# Patient Record
Sex: Female | Born: 1970 | Race: White | Hispanic: No | Marital: Married | Smoking: Never smoker
Health system: Southern US, Community
[De-identification: ages and names within clinical notes are randomized; demographics above are authoritative.]

## PROBLEM LIST (undated history)

## (undated) DIAGNOSIS — R87629 Unspecified abnormal cytological findings in specimens from vagina: Secondary | ICD-10-CM

## (undated) DIAGNOSIS — J338 Other polyp of sinus: Secondary | ICD-10-CM

## (undated) DIAGNOSIS — F329 Major depressive disorder, single episode, unspecified: Secondary | ICD-10-CM

## (undated) DIAGNOSIS — F32A Depression, unspecified: Secondary | ICD-10-CM

## (undated) DIAGNOSIS — Q51810 Arcuate uterus: Secondary | ICD-10-CM

## (undated) DIAGNOSIS — G44219 Episodic tension-type headache, not intractable: Secondary | ICD-10-CM

## (undated) HISTORY — PX: OVARIAN CYST SURGERY: SHX726

## (undated) HISTORY — DX: Major depressive disorder, single episode, unspecified: F32.9

## (undated) HISTORY — DX: Episodic tension-type headache, not intractable: G44.219

## (undated) HISTORY — PX: LEEP: SHX91

## (undated) HISTORY — DX: Other polyp of sinus: J33.8

## (undated) HISTORY — DX: Depression, unspecified: F32.A

## (undated) HISTORY — PX: NASAL SINUS SURGERY: SHX719

## (undated) HISTORY — PX: BREAST ENHANCEMENT SURGERY: SHX7

## (undated) HISTORY — PX: DILATION AND CURETTAGE OF UTERUS: SHX78

## (undated) HISTORY — PX: TONSILLECTOMY AND ADENOIDECTOMY: SUR1326

---

## 1998-01-13 ENCOUNTER — Other Ambulatory Visit: Admission: RE | Admit: 1998-01-13 | Discharge: 1998-01-13 | Payer: Self-pay | Admitting: Obstetrics and Gynecology

## 1999-01-05 ENCOUNTER — Other Ambulatory Visit: Admission: RE | Admit: 1999-01-05 | Discharge: 1999-01-05 | Payer: Self-pay | Admitting: Obstetrics and Gynecology

## 1999-01-29 ENCOUNTER — Other Ambulatory Visit: Admission: RE | Admit: 1999-01-29 | Discharge: 1999-01-29 | Payer: Self-pay | Admitting: *Deleted

## 1999-01-29 ENCOUNTER — Encounter (INDEPENDENT_AMBULATORY_CARE_PROVIDER_SITE_OTHER): Payer: Self-pay

## 1999-03-08 ENCOUNTER — Encounter (INDEPENDENT_AMBULATORY_CARE_PROVIDER_SITE_OTHER): Payer: Self-pay | Admitting: Specialist

## 1999-03-08 ENCOUNTER — Other Ambulatory Visit: Admission: RE | Admit: 1999-03-08 | Discharge: 1999-03-08 | Payer: Self-pay | Admitting: Obstetrics and Gynecology

## 1999-06-24 ENCOUNTER — Other Ambulatory Visit: Admission: RE | Admit: 1999-06-24 | Discharge: 1999-06-24 | Payer: Self-pay | Admitting: Obstetrics and Gynecology

## 2000-07-17 ENCOUNTER — Other Ambulatory Visit: Admission: RE | Admit: 2000-07-17 | Discharge: 2000-07-17 | Payer: Self-pay | Admitting: Obstetrics and Gynecology

## 2000-09-14 ENCOUNTER — Encounter (INDEPENDENT_AMBULATORY_CARE_PROVIDER_SITE_OTHER): Payer: Self-pay | Admitting: Specialist

## 2000-09-14 ENCOUNTER — Ambulatory Visit (HOSPITAL_COMMUNITY): Admission: RE | Admit: 2000-09-14 | Discharge: 2000-09-14 | Payer: Self-pay | Admitting: Obstetrics and Gynecology

## 2001-05-03 ENCOUNTER — Encounter: Payer: Self-pay | Admitting: Emergency Medicine

## 2001-05-03 ENCOUNTER — Emergency Department (HOSPITAL_COMMUNITY): Admission: EM | Admit: 2001-05-03 | Discharge: 2001-05-03 | Payer: Self-pay | Admitting: Emergency Medicine

## 2001-07-23 ENCOUNTER — Other Ambulatory Visit: Admission: RE | Admit: 2001-07-23 | Discharge: 2001-07-23 | Payer: Self-pay | Admitting: Obstetrics and Gynecology

## 2002-08-19 ENCOUNTER — Other Ambulatory Visit: Admission: RE | Admit: 2002-08-19 | Discharge: 2002-08-19 | Payer: Self-pay | Admitting: Obstetrics and Gynecology

## 2003-09-01 ENCOUNTER — Other Ambulatory Visit: Admission: RE | Admit: 2003-09-01 | Discharge: 2003-09-01 | Payer: Self-pay | Admitting: Obstetrics and Gynecology

## 2004-05-18 ENCOUNTER — Other Ambulatory Visit: Admission: RE | Admit: 2004-05-18 | Discharge: 2004-05-18 | Payer: Self-pay | Admitting: Obstetrics and Gynecology

## 2005-05-17 ENCOUNTER — Other Ambulatory Visit: Admission: RE | Admit: 2005-05-17 | Discharge: 2005-05-17 | Payer: Self-pay | Admitting: Obstetrics and Gynecology

## 2007-09-02 ENCOUNTER — Emergency Department (HOSPITAL_COMMUNITY): Admission: EM | Admit: 2007-09-02 | Discharge: 2007-09-02 | Payer: Self-pay | Admitting: Emergency Medicine

## 2009-04-19 IMAGING — CT CT ABDOMEN W/O CM
2 of 4 series · 17 of 46 positions shown, 19 images · non-contrast
Comparison: none

CLINICAL DATA: Bilateral flank pain.  
ABDOMEN CT WITHOUT CONTRAST ? 09/02/07:
TECHNIQUE: Multidetector CT imaging of the abdomen was performed following the standard protocol without IV contrast. 
No priors for comparison.
TECHNIQUE: Multidetector CT imaging of the pelvis was performed following the standard protocol without IV contrast.

[Series 2: <(id) w/o a/p >(id) · axial · non-contrast · 0.65mm/px · z∈[-431,-46]mm · 14 of 85 slices shown, 16 images]
[im 4/85  soft-tissue]
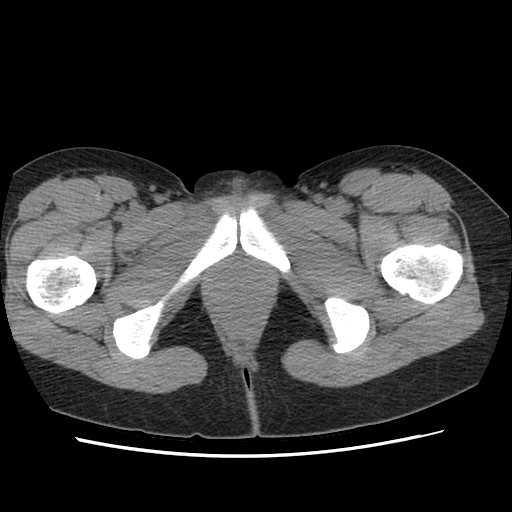
[im 4/85  bone]
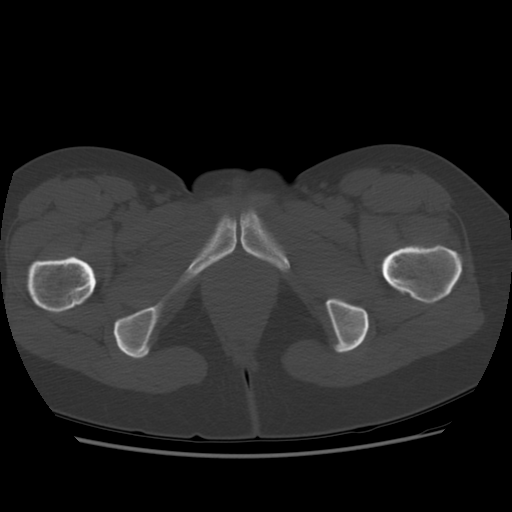
[im 11/85  soft-tissue]
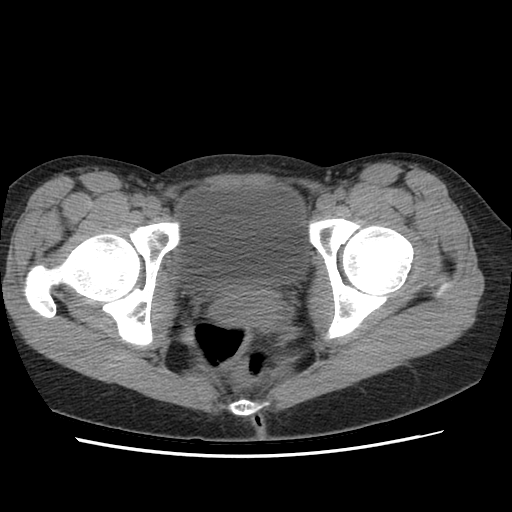
[im 17/85  soft-tissue]
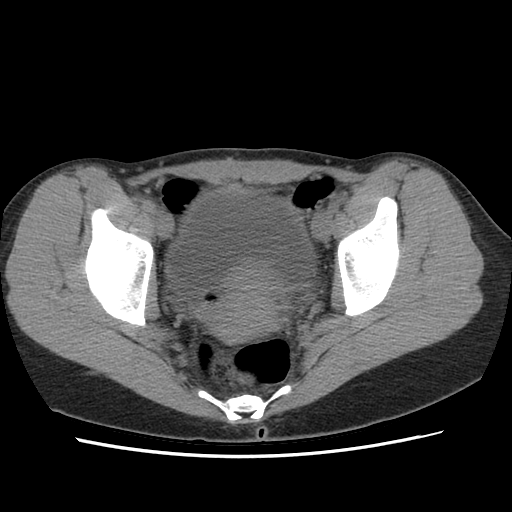
[im 24/85  soft-tissue]
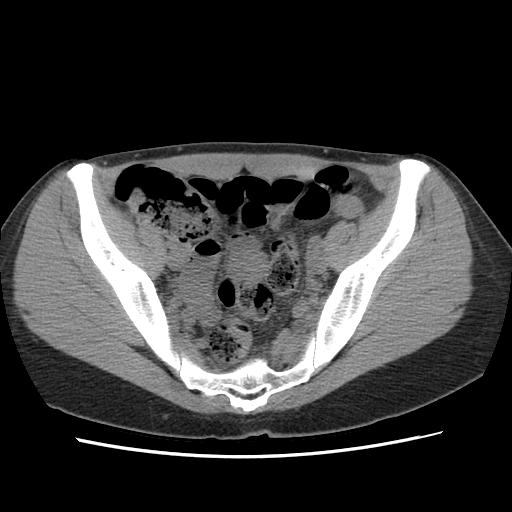
[im 27/85  soft-tissue]
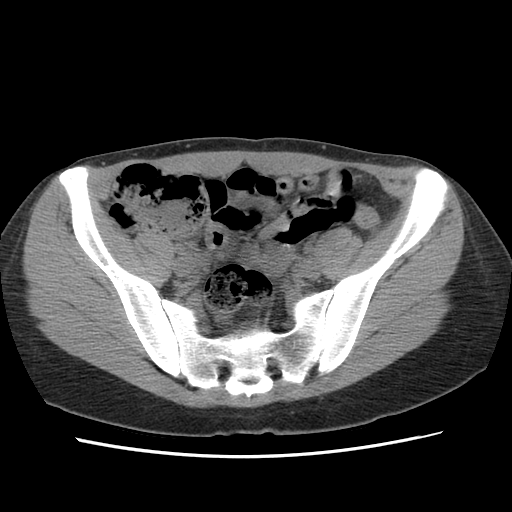
[im 34/85  soft-tissue]
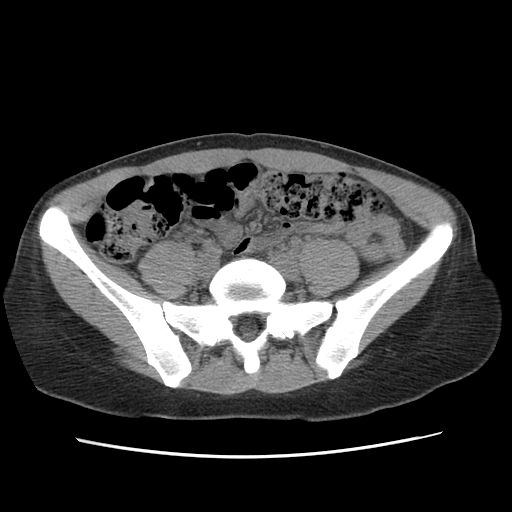
[im 41/85  soft-tissue]
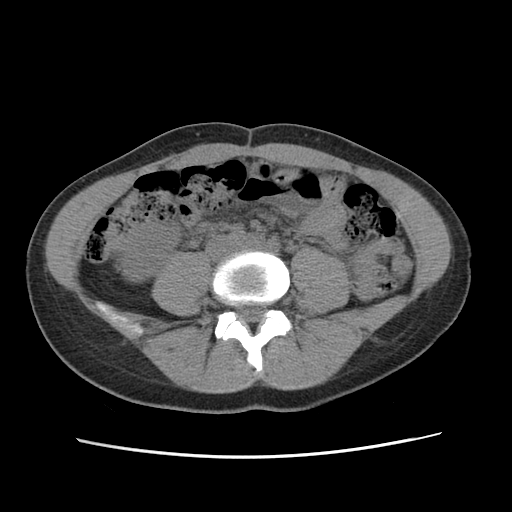
[im 44/85  soft-tissue]
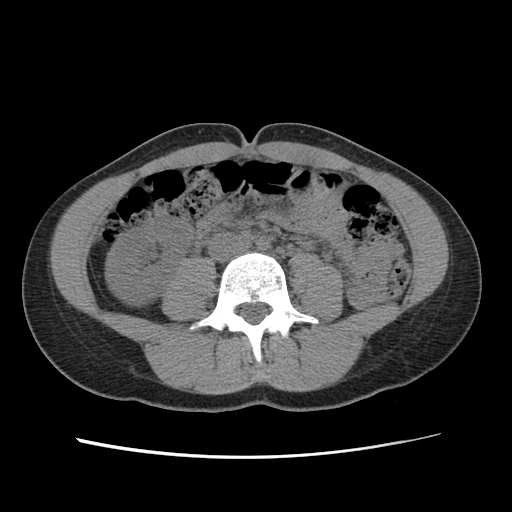
[im 51/85  soft-tissue]
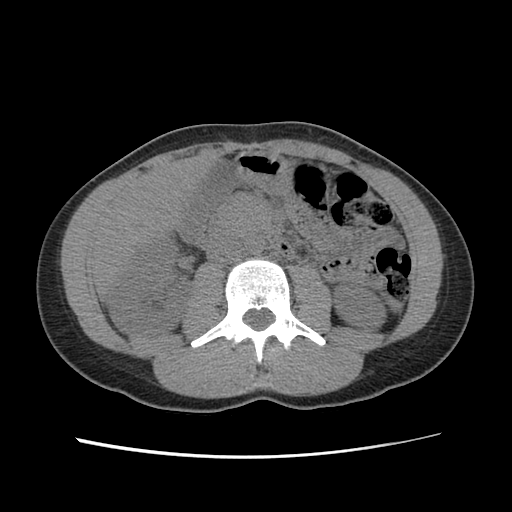
[im 51/85  bone]
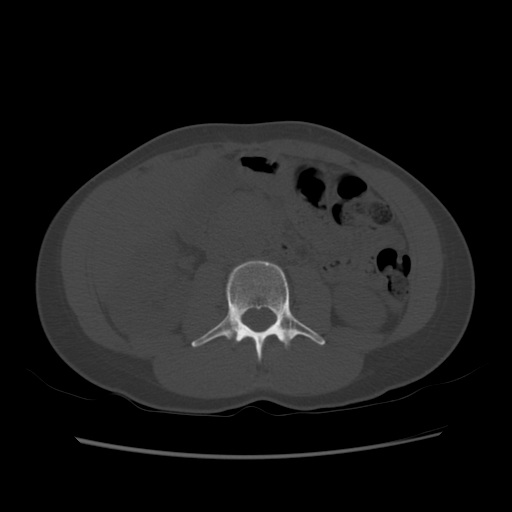
[im 58/85  soft-tissue]
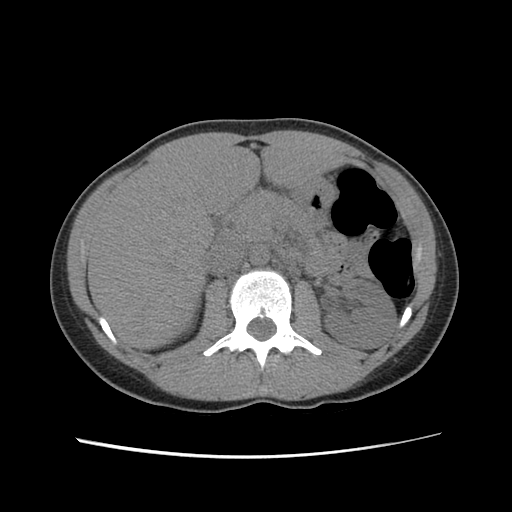
[im 64/85  soft-tissue]
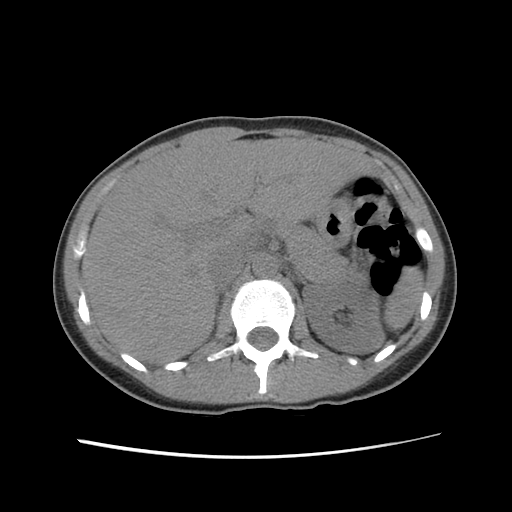
[im 68/85  soft-tissue]
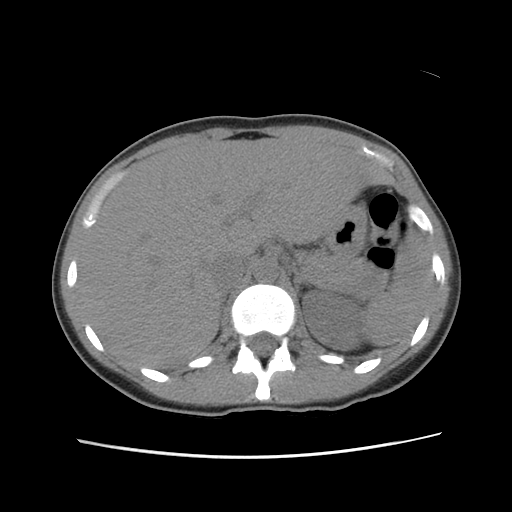
[im 74/85  soft-tissue]
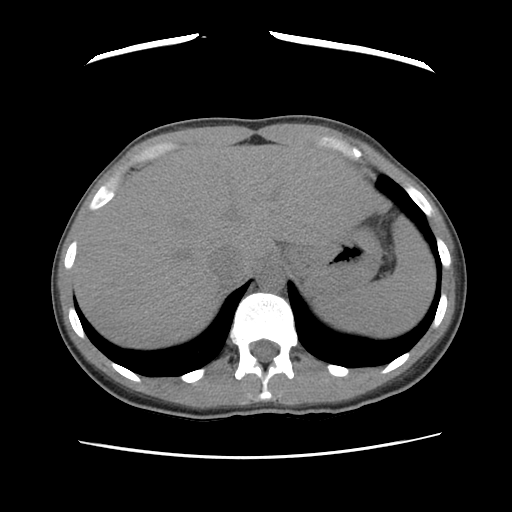
[im 81/85  soft-tissue]
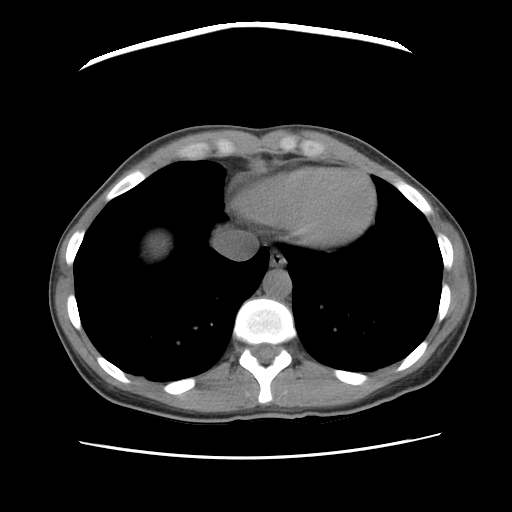

[Series 400: cor a/p · coronal · 0.86mm/px · 3 of 53 slices shown]
[im 18/53  soft-tissue]
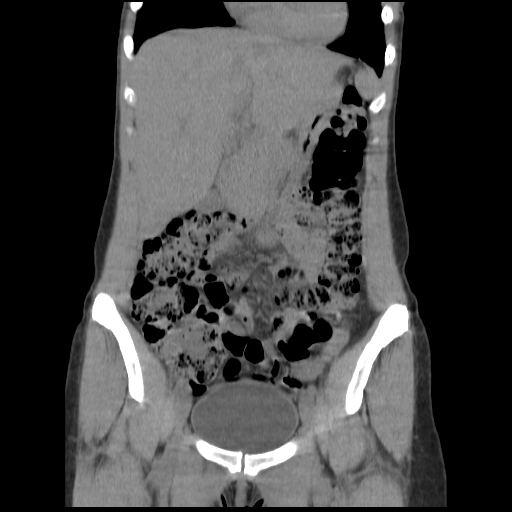
[im 24/53  soft-tissue]
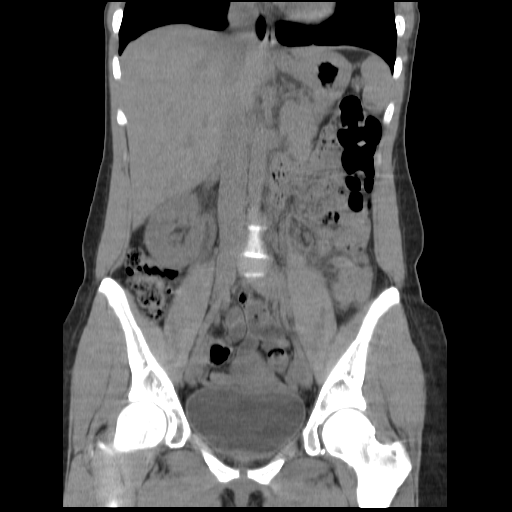
[im 29/53  soft-tissue]
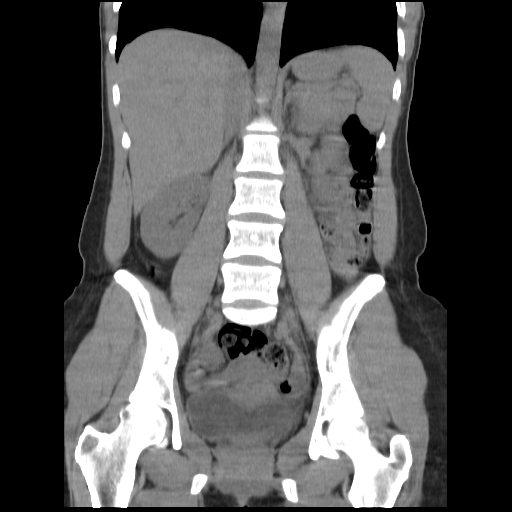

[17 of 46 positions shown; findings below may reference images not displayed]

FINDINGS: There is focal atelectasis in the right lower lobe.  Negative for pleural effusion. 
No renal calculi are identified.  Both kidneys are normal in size and the parenchymal thickness is maintained. There is no hydronephrosis.  The ureters are not dilated.  No ureteral calculus is seen.
The uninfused appearance of the liver, gallbladder, spleen, adrenal glands, and pancreas is unremarkable.  Bowel loops are nondilated.  No ascites or free intraperitoneal air is seen.  
The lumbar spine vertebral bodies are aligned and the vertebral body heights are maintained.  No destructive osseous lesion.
IMPRESSION: Negative for urinary tract calculi or obstruction.
PELVIS CT WITHOUT CONTRAST ? 09/02/07:
FINDINGS: The urinary bladder is unremarkable and moderately distended with urine.  The distal ureters are not dilated.  There are no bladder calculi.  There is no significant free fluid or evidence of abscess.  No inflammatory changes are identified in the right lower quadrant.  A discrete appendix is not definitely identified.  
No acute bony abnormalities.
IMPRESSION: No acute findings in the pelvis.

## 2010-11-05 NOTE — Op Note (Signed)
[facility]  Patient:    Miranda Hoover, Miranda Hoover                       MRN: [id number] Proc. Date: 09/14/00 Adm. Date:  [id number] Attending:  Osborn Coho                           Operative Report  PREOPERATIVE DIAGNOSIS:  Symptomatic persistent right ovarian cyst measuring 8 cm.  POSTOPERATIVE DIAGNOSIS:  Symptomatic persistent right ovarian cyst measuring 8 cm.  PROCEDURE:  Right ovarian cystectomy.  SURGEON:  Dr. Dareen Piano.  ANESTHESIA:  General endotracheal.  ANTIBIOTICS:  Ancef 1 gm.  ESTIMATED BLOOD LOSS:  Minimal.  COMPLICATIONS:  None.  SPECIMENS:  Right ovarian cyst sent to pathology.  FINDINGS:  The patient had a normal appearing liver and the gallbladder was full with an omental adhesions to it. The appendix appeared to be normal. The bowel was normal. The anterior cul-de-sac was normal. There was no evidence of endometriosis or adhesions in the pelvis. The right ovary had a smooth serosal surface. The ovary was not adherent to anything. The right fallopian tube appeared to be normal. The left adnexa was normal. The posterior cul-de-sac was normal.  DESCRIPTION OF PROCEDURE:  The patient was taken to the operating room where she was placed in the dorsal supine position. A general endotracheal anesthetic was administered without complications. She was then placed in the dorsal lithotomy position and prepped with Betadine. A sterile speculum was placed in the vagina and a Hulka tenaculum was applied. The patient was then draped in the usual fashion for this procedure. Her bladder was drained with a red rubber catheter. Marcaine was used in the infraumbilical region. A vertical skin incision was made. A Veress needle was placed in the peritoneal cavity and 3 liters of carbon dioxide was insufflated. The 10 mm trocar was then placed, a 5 mm port was then placed in the suprapubic region under direct visualization. At this point, the  abdominal and pelvic contents were examined and the findings were noted as above. A needle was placed through the scope and the serosal surface of the ovary etched. This cyst was then peeled out completely from the ovary and placed in the posterior cul-de-sac. The trocar site in the suprapubic region was then changed to a 12 mm trocar. An endobag was placed into the peritoneal cavity and when placing the cyst into the endobag the cyst was ruptured and a small amount of fluid was spilled into the posterior cul-de-sac. The remaining cyst and cyst fluid was then taken up through the endobag, drained and the cyst removed. The pelvis was then copiously irrigated, all areas of bleeding remained hemostatic with the Klepinger. This concluded the procedure. The pneumoperitoneum was released. The fascia was closed with #0 Vicryl suture and the skin with 4-0 Vicryl sutures. Steri-Strips were placed. The patient was discharged to home with Tylox to take. She will follow-up in the office in four weeks. DD:  09/14/00 TD:  09/15/00 Job: [id number]

## 2010-12-28 ENCOUNTER — Other Ambulatory Visit: Payer: Self-pay | Admitting: Family Medicine

## 2010-12-28 ENCOUNTER — Encounter: Payer: Self-pay | Admitting: Family Medicine

## 2010-12-28 ENCOUNTER — Ambulatory Visit (INDEPENDENT_AMBULATORY_CARE_PROVIDER_SITE_OTHER): Payer: Managed Care, Other (non HMO) | Admitting: Family Medicine

## 2010-12-28 DIAGNOSIS — K5900 Constipation, unspecified: Secondary | ICD-10-CM | POA: Insufficient documentation

## 2010-12-28 DIAGNOSIS — Z0184 Encounter for antibody response examination: Secondary | ICD-10-CM

## 2010-12-28 DIAGNOSIS — Z0000 Encounter for general adult medical examination without abnormal findings: Secondary | ICD-10-CM | POA: Insufficient documentation

## 2010-12-28 LAB — BASIC METABOLIC PANEL
CO2: 28 mEq/L (ref 19–32)
Chloride: 107 mEq/L (ref 96–112)
Glucose, Bld: 75 mg/dL (ref 70–99)
Potassium: 3.7 mEq/L (ref 3.5–5.1)
Sodium: 141 mEq/L (ref 135–145)

## 2010-12-28 LAB — CBC WITH DIFFERENTIAL/PLATELET
Basophils Absolute: 0 10*3/uL (ref 0.0–0.1)
Eosinophils Relative: 3.1 % (ref 0.0–5.0)
HCT: 41.1 % (ref 36.0–46.0)
Lymphocytes Relative: 29.6 % (ref 12.0–46.0)
Lymphs Abs: 1.8 10*3/uL (ref 0.7–4.0)
Monocytes Relative: 11.2 % (ref 3.0–12.0)
Platelets: 233 10*3/uL (ref 150.0–400.0)
RDW: 13 % (ref 11.5–14.6)
WBC: 6 10*3/uL (ref 4.5–10.5)

## 2010-12-28 LAB — HEPATIC FUNCTION PANEL
AST: 25 U/L (ref 0–37)
Albumin: 4.4 g/dL (ref 3.5–5.2)
Alkaline Phosphatase: 52 U/L (ref 39–117)

## 2010-12-28 NOTE — Patient Instructions (Signed)
Follow up in 1 year or as needed We'll notify you of your lab results Increase your water intake For bouts of constipation take Miralax Call with any questions or concerns Welcome!  We're glad to have you!!

## 2010-12-28 NOTE — Progress Notes (Signed)
  Subjective:    Patient ID: Miranda Hoover, female    DOB: 1970-09-13, 40 y.o.   MRN: [id number]  HPI New to establish.  Never had PCP.  GYN- Dr Dareen Piano.  Psych- Kaur  Constipation- reports bowels have never been regular.     Review of Systems Patient reports no vision/ hearing changes, adenopathy,fever, weight change,  persistant/recurrent hoarseness , swallowing issues, chest pain, palpitations, edema, persistant/recurrent cough, hemoptysis, dyspnea (rest/exertional/paroxysmal nocturnal), gastrointestinal bleeding (melena, rectal bleeding), abdominal pain, significant heartburn, GU symptoms (dysuria, hematuria, incontinence), Gyn symptoms (abnormal  bleeding, pain),  syncope, focal weakness, memory loss, numbness & tingling, skin/hair/nail changes, abnormal bruising or bleeding.     Objective:   Physical Exam  General Appearance:    Alert, cooperative, no distress, appears stated age  Head:    Normocephalic, without obvious abnormality, atraumatic  Eyes:    PERRL, conjunctiva/corneas clear, EOM's intact, fundi    benign, both eyes  Ears:    Normal TM's and external ear canals, both ears  Nose:   Nares normal, septum midline, mucosa normal, no drainage    or sinus tenderness  Throat:   Lips, mucosa, and tongue normal; teeth and gums normal  Neck:   Supple, symmetrical, trachea midline, no adenopathy;    Thyroid: no enlargement/tenderness/nodules  Back:     Symmetric, no curvature, ROM normal, no CVA tenderness  Lungs:     Clear to auscultation bilaterally, respirations unlabored  Chest Wall:    No tenderness or deformity   Heart:    Regular rate and rhythm, S1 and S2 normal, no murmur, rub   or gallop  Breast Exam:    GYN  Abdomen:     Soft, non-tender, bowel sounds active all four quadrants,    no masses, no organomegaly  Genitalia:    GYN  Rectal:    GYN  Extremities:   Extremities normal, atraumatic, no cyanosis or edema  Pulses:   2+ and symmetric all extremities  Skin:    Skin color, texture, turgor normal, no rashes or lesions  Lymph nodes:   Cervical, supraclavicular, and axillary nodes normal  Neurologic:   CNII-XII intact, normal strength, sensation and reflexes    throughout          Assessment & Plan:

## 2010-12-29 LAB — HEPATITIS B SURFACE ANTIBODY,QUALITATIVE: Hep B S Ab: POSITIVE — AB

## 2010-12-29 LAB — HEPATITIS B CORE ANTIBODY, TOTAL: Hep B Core Total Ab: NEGATIVE

## 2010-12-29 LAB — RUBEOLA ANTIBODY IGG: Rubeola IgG: 1.09 {ISR} — ABNORMAL HIGH

## 2010-12-29 LAB — MUMPS ANTIBODY, IGG: Mumps IgG: 1.48 {ISR} — ABNORMAL HIGH

## 2011-01-03 ENCOUNTER — Encounter: Payer: Self-pay | Admitting: *Deleted

## 2011-01-09 NOTE — Assessment & Plan Note (Signed)
Pt's PE WNL.  UTD on GYN screening.  Check labs.  Anticipatory guidance provided. 

## 2011-01-09 NOTE — Assessment & Plan Note (Signed)
Reviewed importance of increased fiber and fluids.  Encouraged regular exercise.  Pt to use Miralax prn for constipation.

## 2011-01-09 NOTE — Assessment & Plan Note (Signed)
Pt reports she needs titers drawn for proof of immunization.  Labs ordered.

## 2011-01-12 ENCOUNTER — Other Ambulatory Visit: Payer: Self-pay | Admitting: Family Medicine

## 2011-01-12 DIAGNOSIS — E785 Hyperlipidemia, unspecified: Secondary | ICD-10-CM

## 2011-01-13 ENCOUNTER — Encounter: Payer: Self-pay | Admitting: *Deleted

## 2011-01-13 ENCOUNTER — Other Ambulatory Visit (INDEPENDENT_AMBULATORY_CARE_PROVIDER_SITE_OTHER): Payer: Managed Care, Other (non HMO)

## 2011-01-13 DIAGNOSIS — E785 Hyperlipidemia, unspecified: Secondary | ICD-10-CM

## 2011-01-13 LAB — LIPID PANEL
LDL Cholesterol: 90 mg/dL (ref 0–99)
Total CHOL/HDL Ratio: 2
Triglycerides: 92 mg/dL (ref 0.0–149.0)

## 2011-01-13 NOTE — Progress Notes (Signed)
Labs only

## 2011-01-26 ENCOUNTER — Ambulatory Visit (INDEPENDENT_AMBULATORY_CARE_PROVIDER_SITE_OTHER): Payer: Managed Care, Other (non HMO) | Admitting: Family

## 2011-01-26 ENCOUNTER — Encounter: Payer: Self-pay | Admitting: Family

## 2011-01-26 DIAGNOSIS — J329 Chronic sinusitis, unspecified: Secondary | ICD-10-CM

## 2011-01-26 DIAGNOSIS — J029 Acute pharyngitis, unspecified: Secondary | ICD-10-CM

## 2011-01-26 LAB — POCT RAPID STREP A (OFFICE): Rapid Strep A Screen: NEGATIVE

## 2011-01-26 MED ORDER — AMOXICILLIN 500 MG PO CAPS: 1000.0000 mg | ORAL_CAPSULE | Freq: Three times a day (TID) | ORAL | Status: AC

## 2011-01-26 NOTE — Patient Instructions (Signed)
Call if symptoms worsen, or if you are not feeling better in 48-72 hours. Continue ibuprofen as needed for fever or comfort.

## 2011-01-26 NOTE — Assessment & Plan Note (Addendum)
Will plan to treat with amoxicillin.  Pt is instructed to use back up form of birth control as abx may interact with her OCP.  She verbalizes understanding. Rapid strep performed today is negative.

## 2011-01-26 NOTE — Progress Notes (Signed)
Subjective:    Patient ID: Miranda Hoover, female    DOB: 19-Jun-1971, 40 y.o.   MRN: [id number]  HPI  Ms.  Margo Hoover is a 40 yr old female who presents today with chief complaint of fever.  She reports a low grade fever 100.3- stared 2 days ago.  Throat sore, reports + cervical LAD.  Notes some post nasal drip, expectorating yellow phlegm.  Denies cough.  Denies known sick contacts.  She has taking advil and zyrtec with little improvement in her symptoms. She reports history of chronic sinus problems and notes that her sinuses "feel like they are acting up."    Review of Systems    see HPI  Past Medical History  Diagnosis Date  . Depression   . Frequent episodic tension-type headache   . Sinus polyp     History   Social History  . Marital Status: Single    Spouse Name: N/A    Number of Children: N/A  . Years of Education: N/A   Occupational History  . Not on file.   Social History Main Topics  . Smoking status: Never Smoker   . Smokeless tobacco: Not on file  . Alcohol Use: Yes  . Drug Use: No  . Sexually Active:    Other Topics Concern  . Not on file   Social History Narrative  . No narrative on file    Past Surgical History  Procedure Date  . Tonsillectomy and adenoidectomy   . Nasal sinus surgery     x3  . Ovarian cyst surgery     Family History  Problem Relation Age of Onset  . Arthritis Father   . Arthritis Maternal Grandmother   . Uterine cancer Maternal Grandmother   . Hyperlipidemia Father   . Heart disease Maternal Grandmother   . Stroke Paternal Grandmother   . Hypertension Father   . Hypertension Maternal Grandmother   . Hypertension Paternal Grandmother   . Diabetes Father   . Diabetes Paternal Grandmother   . Diabetes Paternal Aunt   . Diabetes Paternal Uncle     No Known Allergies  Current Outpatient Prescriptions on File Prior to Visit  Medication Sig Dispense Refill  . buPROPion (WELLBUTRIN XL) 150 MG 24 hr tablet Take 150 mg by  mouth daily.        . cetirizine (ZYRTEC) 10 MG tablet Take 10 mg by mouth as needed.        . citalopram (CELEXA) 40 MG tablet Take 40 mg by mouth daily.        . DiphenhydrAMINE HCl (BENADRYL ALLERGY PO) Take by mouth as needed.        . ethynodiol-ethinyl estradiol (KELNOR) 1-35 MG-MCG per tablet Take 1 tablet by mouth daily.        . fluticasone (FLONASE) 50 MCG/ACT nasal spray Place 2 sprays into the nose daily.          BP 110/80  Pulse 72  Temp(Src) 98.3 F (36.8 C) (Oral)  Resp 16  Ht 5\' 5"  (1.651 m)  Wt 155 lb 0.6 oz (70.326 kg)  BMI 25.80 kg/m2  LMP 01/12/2011    Objective:   Physical Exam  Constitutional: She appears well-developed and well-nourished.  HENT:  Head: Normocephalic and atraumatic.       Tonsils surgically absent.  Slight exudate noted on left lateral pharynx.    + maxillary tenderness to palpation.  Eyes: Conjunctivae are normal.  Neck: Normal range of motion.  Lymphadenopathy:  She has cervical adenopathy.          Assessment & Plan:

## 2011-03-14 LAB — DIFFERENTIAL
Basophils Absolute: 0
Basophils Relative: 0
Eosinophils Absolute: 0.1
Neutrophils Relative %: 75

## 2011-03-14 LAB — HEPATIC FUNCTION PANEL
Albumin: 3.4 — ABNORMAL LOW
Total Bilirubin: 1
Total Protein: 6.3

## 2011-03-14 LAB — POCT PREGNANCY, URINE: Operator id: 285491

## 2011-03-14 LAB — I-STAT 8, (EC8 V) (CONVERTED LAB)
Bicarbonate: 23.7
Glucose, Bld: 94
Hemoglobin: 13.9
Operator id: 285491
Sodium: 134 — ABNORMAL LOW
TCO2: 24

## 2011-03-14 LAB — URINALYSIS, ROUTINE W REFLEX MICROSCOPIC
Ketones, ur: NEGATIVE
Nitrite: NEGATIVE
Protein, ur: NEGATIVE

## 2011-03-14 LAB — CBC
HCT: 36.8
Hemoglobin: 12.6
WBC: 9.9

## 2011-03-14 LAB — LIPASE, BLOOD: Lipase: 18

## 2011-03-14 LAB — POCT I-STAT CREATININE
Creatinine, Ser: 0.9
Operator id: 285491

## 2011-06-21 HISTORY — PX: BUNIONECTOMY: SHX129

## 2011-10-14 ENCOUNTER — Telehealth: Payer: Self-pay | Admitting: Family Medicine

## 2011-10-14 MED ORDER — FLUTICASONE PROPIONATE 50 MCG/ACT NA SUSP: 2.0000 | Freq: Every day | NASAL | Status: DC

## 2011-10-14 NOTE — Telephone Encounter (Signed)
Refill: Fluticasone prop 50 mcg spray. Use 2 sprays in both nostrils given once a day for 30 days. Qty 16. Last fill 12.22.12

## 2012-01-02 ENCOUNTER — Ambulatory Visit (INDEPENDENT_AMBULATORY_CARE_PROVIDER_SITE_OTHER): Payer: Managed Care, Other (non HMO) | Admitting: Family Medicine

## 2012-01-02 ENCOUNTER — Encounter: Payer: Self-pay | Admitting: Family Medicine

## 2012-01-02 VITALS — BP 115/81 | HR 86 | Temp 98.3°F | Ht 65.25 in | Wt 151.0 lb

## 2012-01-02 DIAGNOSIS — Z0000 Encounter for general adult medical examination without abnormal findings: Secondary | ICD-10-CM

## 2012-01-02 LAB — CBC WITH DIFFERENTIAL/PLATELET
Basophils Absolute: 0 10*3/uL (ref 0.0–0.1)
Eosinophils Absolute: 0.1 10*3/uL (ref 0.0–0.7)
HCT: 43 % (ref 36.0–46.0)
Hemoglobin: 14.4 g/dL (ref 12.0–15.0)
Lymphocytes Relative: 33.2 % (ref 12.0–46.0)
Lymphs Abs: 1.9 10*3/uL (ref 0.7–4.0)
MCHC: 33.6 g/dL (ref 30.0–36.0)
Neutro Abs: 3 10*3/uL (ref 1.4–7.7)
RDW: 12.9 % (ref 11.5–14.6)

## 2012-01-02 LAB — LIPID PANEL
Cholesterol: 153 mg/dL (ref 0–200)
LDL Cholesterol: 84 mg/dL (ref 0–99)
Triglycerides: 45 mg/dL (ref 0.0–149.0)
VLDL: 9 mg/dL (ref 0.0–40.0)

## 2012-01-02 LAB — HEPATIC FUNCTION PANEL
Bilirubin, Direct: 0.1 mg/dL (ref 0.0–0.3)
Total Bilirubin: 0.5 mg/dL (ref 0.3–1.2)

## 2012-01-02 LAB — BASIC METABOLIC PANEL
Calcium: 9.3 mg/dL (ref 8.4–10.5)
Creatinine, Ser: 0.8 mg/dL (ref 0.4–1.2)
GFR: 82.01 mL/min (ref >60.00)

## 2012-01-02 MED ORDER — FLUTICASONE PROPIONATE 50 MCG/ACT NA SUSP: 2.0000 | Freq: Every day | NASAL | Status: DC

## 2012-01-02 NOTE — Progress Notes (Signed)
  Subjective:    Patient ID: Miranda Hoover, female    DOB: 1970/08/20, 41 y.o.   MRN: [id number]  HPI CPE.  Has GYN.  No concerns.  Attempting to get pregnant.   Review of Systems Patient reports no vision/ hearing changes, adenopathy,fever, weight change,  persistant/recurrent hoarseness , swallowing issues, chest pain, palpitations, edema, persistant/recurrent cough, hemoptysis, dyspnea (rest/exertional/paroxysmal nocturnal), gastrointestinal bleeding (melena, rectal bleeding), abdominal pain, significant heartburn, bowel changes, GU symptoms (dysuria, hematuria, incontinence), Gyn symptoms (abnormal  bleeding, pain),  syncope, focal weakness, memory loss, numbness & tingling, skin/hair/nail changes, abnormal bruising or bleeding, anxiety, or depression.     Objective:   Physical Exam General Appearance:    Alert, cooperative, no distress, appears stated age  Head:    Normocephalic, without obvious abnormality, atraumatic  Eyes:    PERRL, conjunctiva/corneas clear, EOM's intact, fundi    benign, both eyes  Ears:    Normal TM's and external ear canals, both ears  Nose:   Nares normal, septum midline, mucosa normal, no drainage    or sinus tenderness  Throat:   Lips, mucosa, and tongue normal; teeth and gums normal  Neck:   Supple, symmetrical, trachea midline, no adenopathy;    Thyroid: no enlargement/tenderness/nodules  Back:     Symmetric, no curvature, ROM normal, no CVA tenderness  Lungs:     Clear to auscultation bilaterally, respirations unlabored  Chest Wall:    No tenderness or deformity   Heart:    Regular rate and rhythm, S1 and S2 normal, no murmur, rub   or gallop  Breast Exam:    Deferred to GYN  Abdomen:     Soft, non-tender, bowel sounds active all four quadrants,    no masses, no organomegaly  Genitalia:    Deferred to GYN  Rectal:    Extremities:   Extremities normal, atraumatic, no cyanosis or edema  Pulses:   2+ and symmetric all extremities  Skin:   Skin color,  texture, turgor normal, no rashes or lesions  Lymph nodes:   Cervical, supraclavicular, and axillary nodes normal  Neurologic:   CNII-XII intact, normal strength, sensation and reflexes    throughout          Assessment & Plan:

## 2012-01-02 NOTE — Assessment & Plan Note (Signed)
Pt's PE WNL.  UTD on GYN.  Check labs.  Anticipatory guidance provided.  

## 2012-01-02 NOTE — Patient Instructions (Addendum)
We'll notify you of your lab results Keep up the good work!  You look great! Call with any questions or concerns Good luck practicing- don't stress!!!!

## 2012-01-03 ENCOUNTER — Encounter: Payer: Self-pay | Admitting: *Deleted

## 2012-04-30 ENCOUNTER — Other Ambulatory Visit (HOSPITAL_COMMUNITY): Payer: Self-pay | Admitting: Obstetrics and Gynecology

## 2012-04-30 DIAGNOSIS — N979 Female infertility, unspecified: Secondary | ICD-10-CM

## 2012-05-02 ENCOUNTER — Ambulatory Visit (HOSPITAL_COMMUNITY)
Admission: RE | Admit: 2012-05-02 | Discharge: 2012-05-02 | Disposition: A | Discharge status: Home / Self Care | Payer: Managed Care, Other (non HMO) | Source: Ambulatory Visit | Attending: Obstetrics and Gynecology | Admitting: Obstetrics and Gynecology

## 2012-05-02 DIAGNOSIS — N979 Female infertility, unspecified: Secondary | ICD-10-CM | POA: Insufficient documentation

## 2012-05-02 MED ORDER — IOHEXOL 300 MG/ML  SOLN: 8.0000 mL | Freq: Once | INTRAMUSCULAR | Status: AC | PRN

## 2012-10-11 ENCOUNTER — Encounter: Payer: Self-pay | Admitting: Internal Medicine

## 2012-10-11 ENCOUNTER — Ambulatory Visit (INDEPENDENT_AMBULATORY_CARE_PROVIDER_SITE_OTHER): Payer: 59 | Admitting: Internal Medicine

## 2012-10-11 VITALS — BP 112/68 | HR 75 | Temp 98.1°F | Ht 65.25 in | Wt 153.0 lb

## 2012-10-11 DIAGNOSIS — J019 Acute sinusitis, unspecified: Secondary | ICD-10-CM

## 2012-10-11 MED ORDER — AMOXICILLIN 500 MG PO CAPS: 500.0000 mg | ORAL_CAPSULE | Freq: Three times a day (TID) | ORAL | Status: DC

## 2012-10-11 NOTE — Progress Notes (Signed)
HPI  Pt presents to the clinic today with c/o ongoing allergy issues. She has been having allergy issues for the past 2 months. In the last week the have gotten much worse. She has more nasal congestion, headache, ear pain, and sore throat. She has been taking Zyrtec, Flonase and Nyquil. The symptoms continue to get worse. She is supposed to be having IUI today.  Review of Systems    Past Medical History  Diagnosis Date  . Depression   . Frequent episodic tension-type headache   . Sinus polyp     Family History  Problem Relation Age of Onset  . Arthritis Father   . Arthritis Maternal Grandmother   . Uterine cancer Maternal Grandmother   . Hyperlipidemia Father   . Heart disease Maternal Grandmother   . Stroke Paternal Grandmother   . Hypertension Father   . Hypertension Maternal Grandmother   . Hypertension Paternal Grandmother   . Diabetes Father   . Diabetes Paternal Grandmother   . Diabetes Paternal Aunt   . Diabetes Paternal Uncle     History   Social History  . Marital Status: Single    Spouse Name: N/A    Number of Children: N/A  . Years of Education: N/A   Occupational History  . Not on file.   Social History Main Topics  . Smoking status: Never Smoker   . Smokeless tobacco: Not on file  . Alcohol Use: Yes  . Drug Use: No  . Sexually Active: Not on file   Other Topics Concern  . Not on file   Social History Narrative  . No narrative on file    No Known Allergies   Constitutional: Positive headache, fatigue and fever. Denies fever or abrupt weight changes.  HEENT:  Positive eye pain, pressure behind the eyes, facial pain, nasal congestion and sore throat. Denies eye redness, ear pain, ringing in the ears, wax buildup, runny nose or bloody nose. Respiratory: Denies cough, sputum production, difficulty breathing or shortness of breath.  Cardiovascular: Denies chest pain, chest tightness, palpitations or swelling in the hands or feet.   No other  specific complaints in a complete review of systems (except as listed in HPI above).  Objective:    BP 112/68  Pulse 75  Temp(Src) 98.1 F (36.7 C) (Oral)  Ht 5' 5.25" (1.657 m)  Wt 153 lb (69.4 kg)  BMI 25.28 kg/m2  SpO2 97% Wt Readings from Last 3 Encounters:  10/11/12 153 lb (69.4 kg)  01/02/12 151 lb (68.493 kg)  01/26/11 155 lb 0.6 oz (70.326 kg)    General: Appears her stated age, well developed, well nourished in NAD. HEENT: Head: normal shape and size; Eyes: sclera white, no icterus, conjunctiva pink, PERRLA and EOMs intact; Ears: Tm's gray and intact, normal light reflex; Nose: mucosa pink and moist, septum midline; Throat/Mouth: + PND. Teeth present, mucosa pink and moist, no exudate noted, no lesions or ulcerations noted.  Neck: Mild cervical lymphadenopathy. Neck supple, trachea midline. No massses, lumps or thyromegaly present.  Cardiovascular: Normal rate and rhythm. S1,S2 noted.  No murmur, rubs or gallops noted. No JVD or BLE edema. No carotid bruits noted. Pulmonary/Chest: Normal effort and positive vesicular breath sounds. No respiratory distress. No wheezes, rales or ronchi noted.      Assessment & Plan:   Acute bacterial sinusitis  Can use a Neti Pot which can be purchased from your local drug store. Continue Zyrtec and Flonase Amoxicillin TID  for 10 days  RTC  as needed or if symptoms persist.

## 2012-10-11 NOTE — Patient Instructions (Signed)

## 2012-12-10 ENCOUNTER — Ambulatory Visit (INDEPENDENT_AMBULATORY_CARE_PROVIDER_SITE_OTHER): Payer: 59 | Admitting: Family Medicine

## 2012-12-10 ENCOUNTER — Encounter: Payer: Self-pay | Admitting: Family Medicine

## 2012-12-10 VITALS — BP 102/76 | HR 68 | Temp 98.7°F | Wt 156.2 lb

## 2012-12-10 DIAGNOSIS — M549 Dorsalgia, unspecified: Secondary | ICD-10-CM

## 2012-12-10 DIAGNOSIS — N390 Urinary tract infection, site not specified: Secondary | ICD-10-CM

## 2012-12-10 LAB — POCT URINALYSIS DIPSTICK
Bilirubin, UA: NEGATIVE
Glucose, UA: NEGATIVE
Nitrite, UA: POSITIVE
Spec Grav, UA: <=1.005

## 2012-12-10 MED ORDER — CEPHALEXIN 500 MG PO CAPS: 500.0000 mg | ORAL_CAPSULE | Freq: Two times a day (BID) | ORAL | Status: AC

## 2012-12-10 NOTE — Patient Instructions (Addendum)
This is a UTI or bladder infection Drink plenty of fluids Take the Keflex twice daily x5 days Call with any questions or concerns Have a great summer!

## 2012-12-10 NOTE — Assessment & Plan Note (Signed)
New.  Pt's sxs and PE consistent w/ infxn.  Start abx.  Reviewed supportive care and red flags that should prompt return.  Pt expressed understanding and is in agreement w/ plan.  

## 2012-12-10 NOTE — Progress Notes (Signed)
  Subjective:    Patient ID: Miranda Hoover, female    DOB: 05-Dec-1970, 42 y.o.   MRN: [id number]  HPI UTI- sxs started ~1 week ago w/ pressure when urinating.  + urgency.  No frequency, no hesitancy.  + low back pain.  No fevers.  No N/V.  This AM had visible blood in urine.  Hx of similar previous.   Review of Systems For ROS see HPI     Objective:   Physical Exam  Vitals reviewed. Constitutional: She appears well-developed and well-nourished. No distress.  Abdominal: Soft. She exhibits no distension. There is no tenderness (no suprapubic or CVA tenderness).          Assessment & Plan:

## 2012-12-13 LAB — URINE CULTURE: Colony Count: >=100000

## 2012-12-26 ENCOUNTER — Ambulatory Visit (INDEPENDENT_AMBULATORY_CARE_PROVIDER_SITE_OTHER): Payer: 59 | Admitting: Family Medicine

## 2012-12-26 ENCOUNTER — Encounter: Payer: Self-pay | Admitting: *Deleted

## 2012-12-26 ENCOUNTER — Encounter: Payer: Self-pay | Admitting: Family Medicine

## 2012-12-26 VITALS — BP 98/70 | HR 69 | Temp 98.1°F | Ht 65.25 in | Wt 154.8 lb

## 2012-12-26 DIAGNOSIS — Z0000 Encounter for general adult medical examination without abnormal findings: Secondary | ICD-10-CM

## 2012-12-26 LAB — HEPATIC FUNCTION PANEL
ALT: 18 U/L (ref 0–35)
AST: 25 U/L (ref 0–37)
Alkaline Phosphatase: 51 U/L (ref 39–117)
Bilirubin, Direct: 0.1 mg/dL (ref 0.0–0.3)
Total Protein: 7.4 g/dL (ref 6.0–8.3)

## 2012-12-26 LAB — CBC WITH DIFFERENTIAL/PLATELET
Basophils Absolute: 0 10*3/uL (ref 0.0–0.1)
Hemoglobin: 14.1 g/dL (ref 12.0–15.0)
Lymphocytes Relative: 33.9 % (ref 12.0–46.0)
Monocytes Relative: 11 % (ref 3.0–12.0)
Neutro Abs: 2.9 10*3/uL (ref 1.4–7.7)
Neutrophils Relative %: 51.8 % (ref 43.0–77.0)
RBC: 4.76 Mil/uL (ref 3.87–5.11)
RDW: 13.2 % (ref 11.5–14.6)

## 2012-12-26 LAB — BASIC METABOLIC PANEL
CO2: 29 mEq/L (ref 19–32)
Chloride: 106 mEq/L (ref 96–112)
Creatinine, Ser: 0.7 mg/dL (ref 0.4–1.2)
Potassium: 4.6 mEq/L (ref 3.5–5.1)

## 2012-12-26 LAB — LIPID PANEL
LDL Cholesterol: 70 mg/dL (ref 0–99)
Total CHOL/HDL Ratio: 2
VLDL: 6 mg/dL (ref 0.0–40.0)

## 2012-12-26 NOTE — Progress Notes (Signed)
  Subjective:    Patient ID: Miranda Hoover, female    DOB: 07/06/1970, 42 y.o.   MRN: [id number]  HPI CPE- UTD on GYN.  No concerns.   Review of Systems Patient reports no vision/ hearing changes, adenopathy,fever, weight change,  persistant/recurrent hoarseness , swallowing issues, chest pain, palpitations, edema, persistant/recurrent cough, hemoptysis, dyspnea (rest/exertional/paroxysmal nocturnal), gastrointestinal bleeding (melena, rectal bleeding), abdominal pain, significant heartburn, bowel changes, GU symptoms (dysuria, hematuria, incontinence), Gyn symptoms (abnormal  bleeding, pain),  syncope, focal weakness, memory loss, numbness & tingling, skin/hair/nail changes, abnormal bruising or bleeding, anxiety, or depression.     Objective:   Physical Exam General Appearance:    Alert, cooperative, no distress, appears stated age  Head:    Normocephalic, without obvious abnormality, atraumatic  Eyes:    PERRL, conjunctiva/corneas clear, EOM's intact, fundi    benign, both eyes  Ears:    Normal TM's and external ear canals, both ears  Nose:   Nares normal, septum midline, mucosa normal, no drainage    or sinus tenderness  Throat:   Lips, mucosa, and tongue normal; teeth and gums normal  Neck:   Supple, symmetrical, trachea midline, no adenopathy;    Thyroid: no enlargement/tenderness/nodules  Back:     Symmetric, no curvature, ROM normal, no CVA tenderness  Lungs:     Clear to auscultation bilaterally, respirations unlabored  Chest Wall:    No tenderness or deformity   Heart:    Regular rate and rhythm, S1 and S2 normal, no murmur, rub   or gallop  Breast Exam:    Deferred to GYN  Abdomen:     Soft, non-tender, bowel sounds active all four quadrants,    no masses, no organomegaly  Genitalia:    Deferred to GYN  Rectal:    Extremities:   Extremities normal, atraumatic, no cyanosis or edema  Pulses:   2+ and symmetric all extremities  Skin:   Skin color, texture, turgor normal, no  rashes or lesions  Lymph nodes:   Cervical, supraclavicular, and axillary nodes normal  Neurologic:   CNII-XII intact, normal strength, sensation and reflexes    throughout          Assessment & Plan:

## 2012-12-26 NOTE — Assessment & Plan Note (Signed)
Pt's PE WNL.  UTD on GYN.  Check labs.  Anticipatory guidance provided.  

## 2012-12-26 NOTE — Patient Instructions (Addendum)
Follow up in 1 year or as needed Keep up the good work!  You look great! Call with any questions or concerns GOOD LUCK!!!

## 2012-12-31 LAB — VITAMIN D 1,25 DIHYDROXY
Vitamin D 1, 25 (OH)2 Total: 60 pg/mL (ref 18–72)
Vitamin D2 1, 25 (OH)2: <8 pg/mL
Vitamin D3 1, 25 (OH)2: 60 pg/mL

## 2013-01-02 ENCOUNTER — Encounter: Payer: Self-pay | Admitting: *Deleted

## 2013-01-11 ENCOUNTER — Other Ambulatory Visit: Payer: Self-pay | Admitting: Family Medicine

## 2013-01-11 NOTE — Telephone Encounter (Signed)
Rx sent to the pharmacy by e-script.//AB/CMA 

## 2013-10-18 DIAGNOSIS — F329 Major depressive disorder, single episode, unspecified: Secondary | ICD-10-CM | POA: Insufficient documentation

## 2013-10-18 DIAGNOSIS — F32A Depression, unspecified: Secondary | ICD-10-CM | POA: Insufficient documentation

## 2013-11-08 ENCOUNTER — Telehealth: Payer: Self-pay | Admitting: Family Medicine

## 2013-11-08 NOTE — Telephone Encounter (Signed)
Apt scheduled for CPE  Pt needs the immunization for MMR asap for the fertility treatment.

## 2013-11-08 NOTE — Telephone Encounter (Signed)
Caller name: [name]  Call back number:[phone]   Reason for call:  Pt had lab done at [facility] and after they received the results, the pt was advised to see MD about getting MMR immunization.  Please advise if we can get this visit scheduled or does she need to come in for office visit.    Pt also noted that she needs an annual wellness visit for her work before the end of July.  Advised pt that the first available was in August, but pt wanted to ask to see if she could get in sooner.

## 2013-11-08 NOTE — Telephone Encounter (Signed)
Pt does not need nurse visit for MMR (we have labs from July 2012 showing she is immune to MMR)

## 2013-11-08 NOTE — Telephone Encounter (Signed)
Called and LM for pt informing that per our records she does not need an MMR due to immunity. Advised pt to bring in papers from [facility] so we can see what they advised.

## 2013-11-08 NOTE — Telephone Encounter (Signed)
Pt last Physical was 12-26-12. Please advise if ok to schedule immunization on nurse visit, or would you like to see pt in a follow up appt first.

## 2013-11-15 ENCOUNTER — Telehealth: Payer: Self-pay | Admitting: Family Medicine

## 2013-11-15 NOTE — Telephone Encounter (Signed)
Left message for pt to call back and schedule RN visit for MMR.

## 2013-11-18 NOTE — Telephone Encounter (Signed)
complete

## 2013-11-20 ENCOUNTER — Ambulatory Visit (INDEPENDENT_AMBULATORY_CARE_PROVIDER_SITE_OTHER): Payer: 59 | Admitting: *Deleted

## 2013-11-20 DIAGNOSIS — Z23 Encounter for immunization: Secondary | ICD-10-CM

## 2013-12-18 IMAGING — RF DG HYSTEROGRAM
7 series · 7 of 7 positions shown · IV contrast (omnipaque)
Comparison: none

CLINICAL DATA: Primary infertility.

HYSTEROSALPINGOGRAM
TECHNIQUE: Following cleansing of the cervix and vagina with
Betadine solution, a hysterosalpingogram was performed using a 5-
French hysterosalpingogram catheter and Omnipaque 300 contrast.
The patient tolerated the examination without difficulty.
Fluoroscopy time: 0.8 minutes.

[Series 1: run · 1 of 1 slices shown (1 of 7)]
[im 1/1]
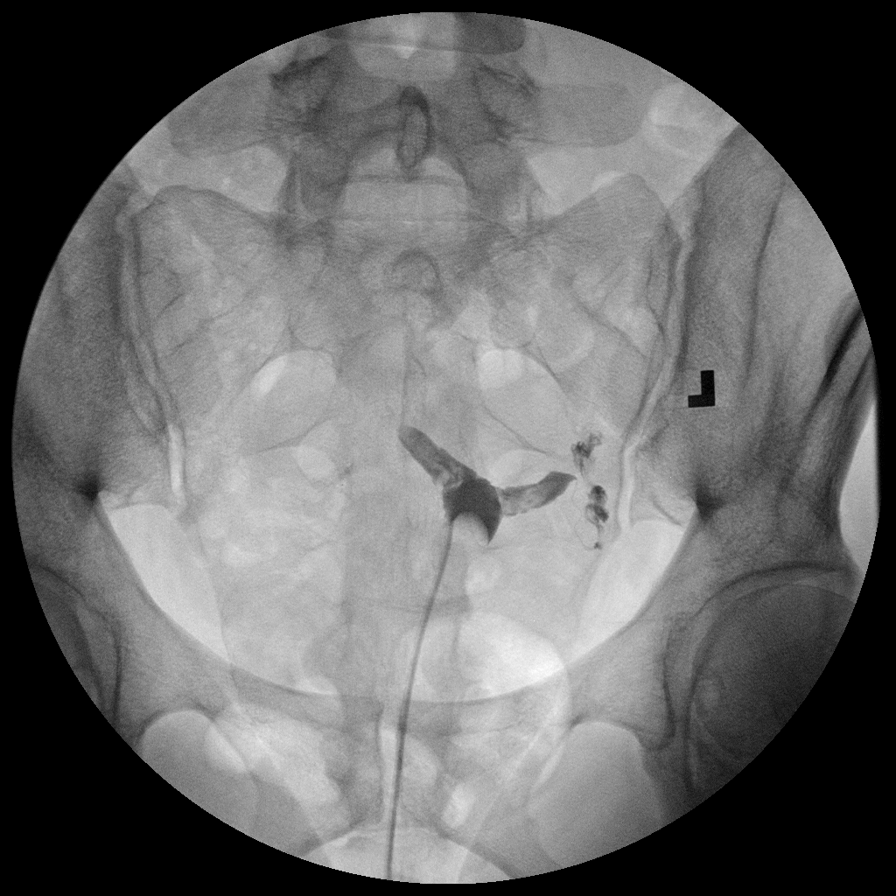

[Series 2: run · 1 of 1 slices shown (2 of 7)]
[im 1/1]
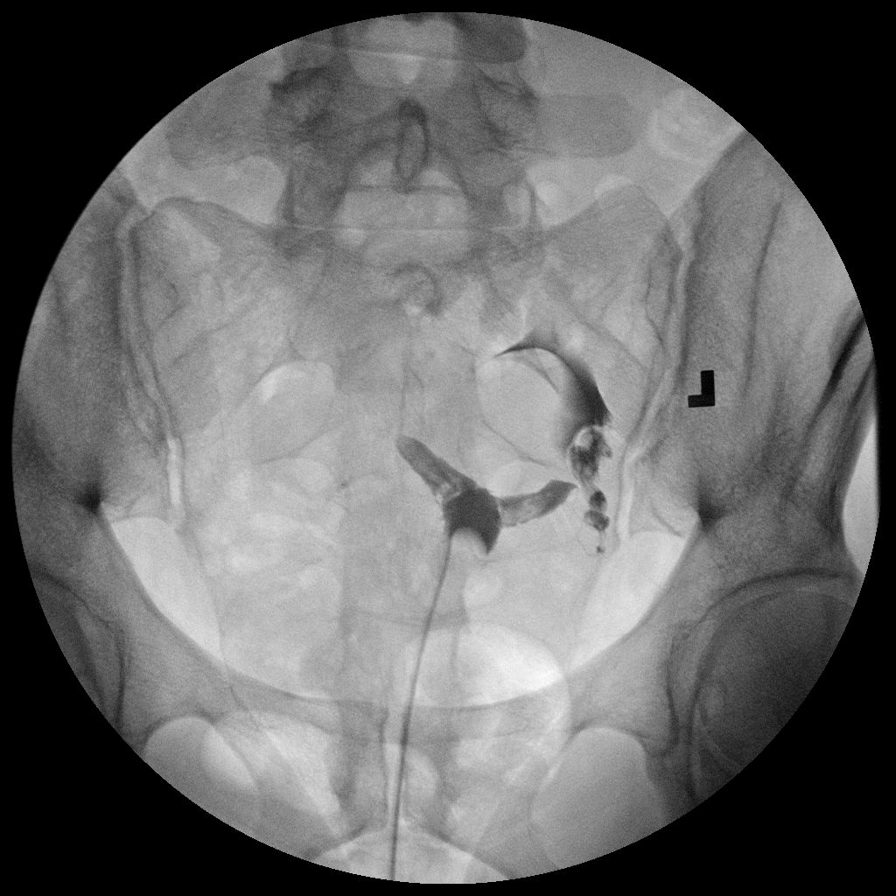

[Series 3: run · 1 of 1 slices shown (3 of 7)]
[im 1/1]
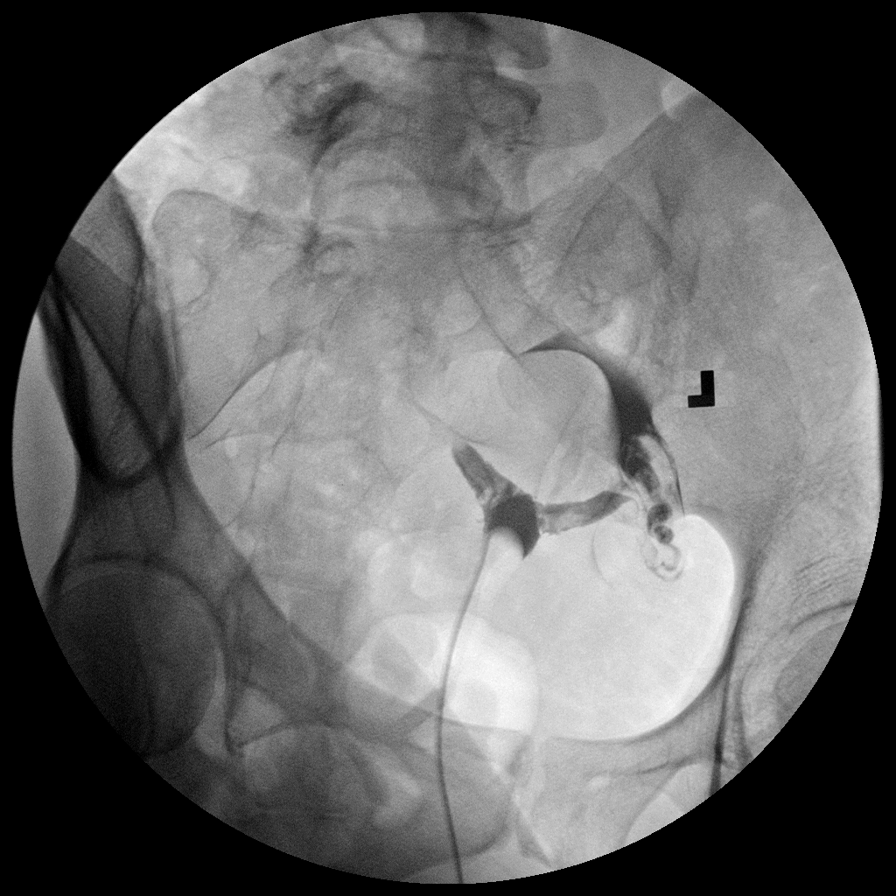

[Series 4: run · 1 of 1 slices shown (4 of 7)]
[im 1/1]
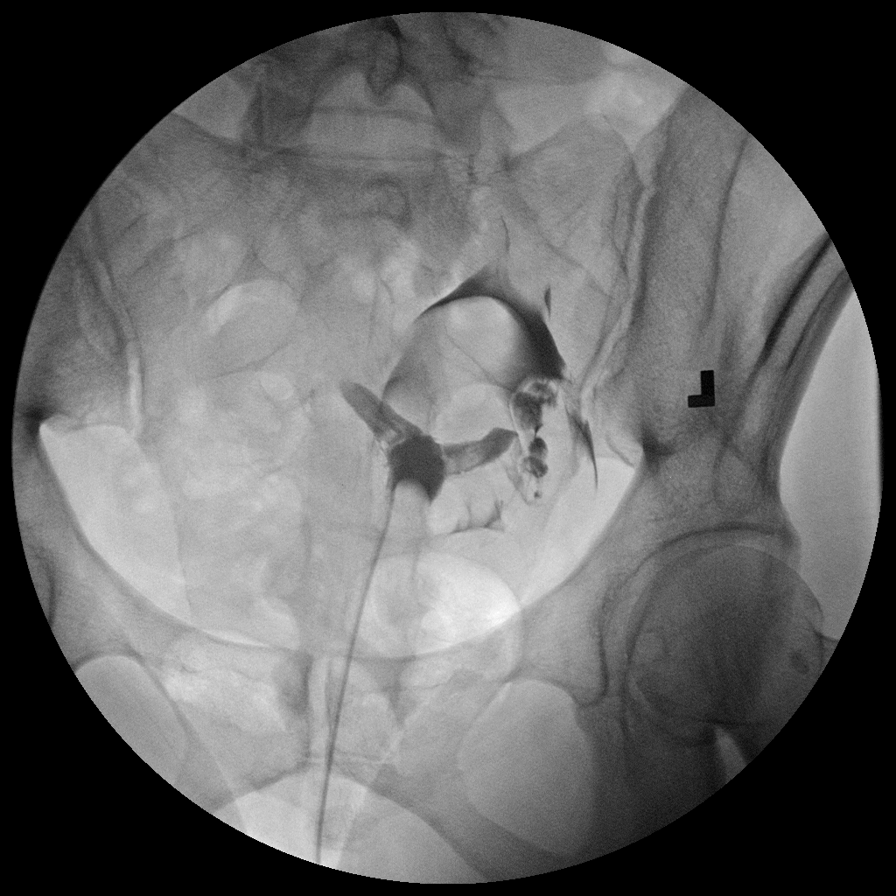

[Series 5: run · 1 of 1 slices shown (5 of 7)]
[im 1/1]
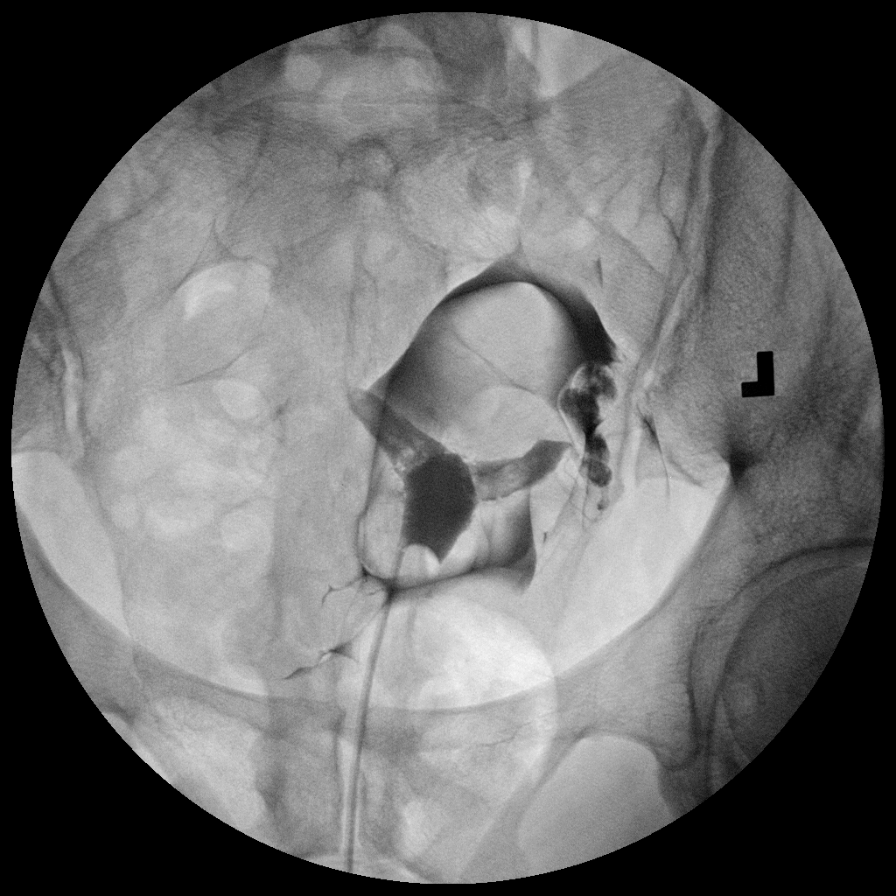

[Series 6: run · 1 of 1 slices shown (6 of 7)]
[im 1/1]
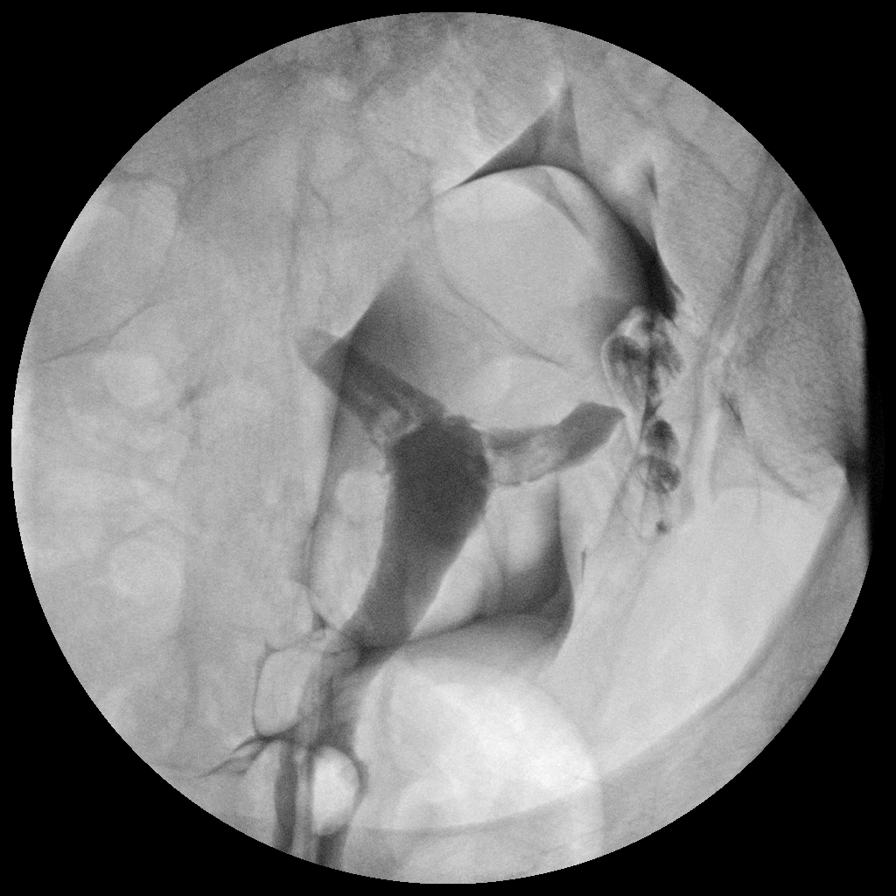

[Series 7: run · 1 of 1 slices shown (7 of 7)]
[im 1/1]
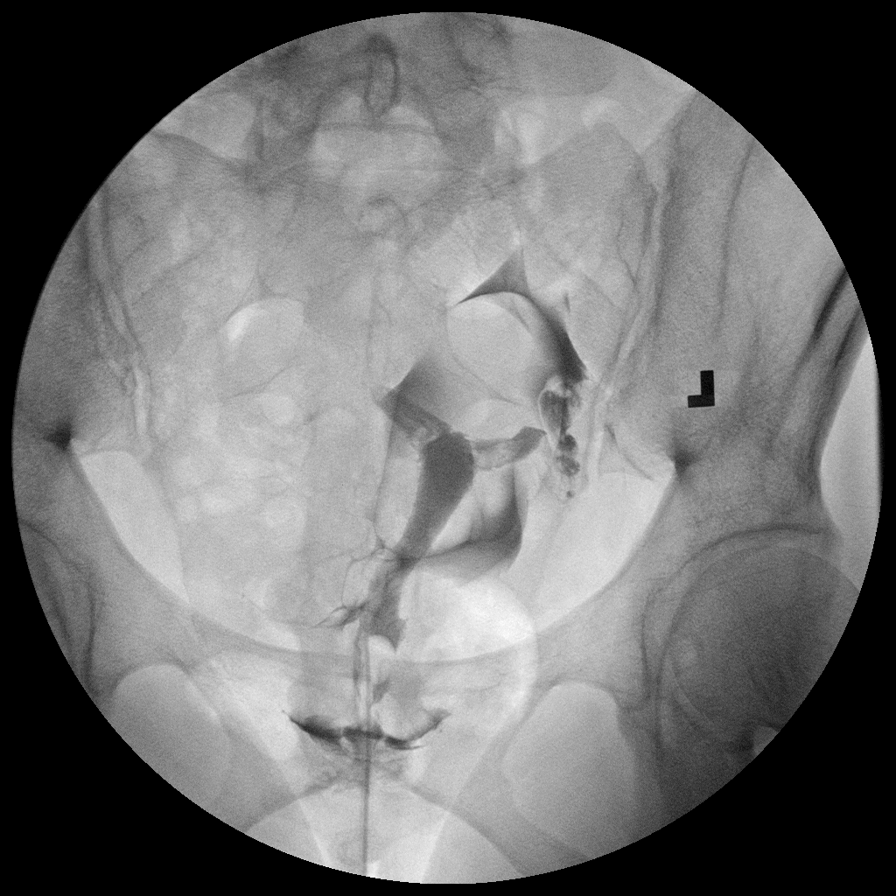

[7 of 7 positions shown; findings below may reference images not displayed]

FINDINGS: Mild concave indentation of the fundal portion of the
endometrial cavity is seen, without evidence of a myometrial
septum.  This is consistent with an arcuate uterus.

Complete contrast opacification of the left fallopian tube is
demonstrated, with free intraperitoneal spill of contrast
demonstrated.

No contrast opacification of the right fallopian tube is seen,
consistent with proximal tubal occlusion.
IMPRESSION: 1.  Patent left fallopian tube.
2.  Proximal occlusion of the right fallopian tube.
3.  Arcuate uterus incidentally noted.

## 2014-01-07 ENCOUNTER — Ambulatory Visit (INDEPENDENT_AMBULATORY_CARE_PROVIDER_SITE_OTHER): Payer: 59 | Admitting: Family Medicine

## 2014-01-07 ENCOUNTER — Encounter: Payer: Self-pay | Admitting: Family Medicine

## 2014-01-07 VITALS — BP 126/80 | HR 74 | Temp 97.9°F | Resp 16 | Ht 65.0 in | Wt 155.0 lb

## 2014-01-07 DIAGNOSIS — Z0000 Encounter for general adult medical examination without abnormal findings: Secondary | ICD-10-CM

## 2014-01-07 LAB — CBC WITH DIFFERENTIAL/PLATELET
BASOS ABS: 0 10*3/uL (ref 0.0–0.1)
Basophils Relative: 0.5 % (ref 0.0–3.0)
Eosinophils Absolute: 0.1 10*3/uL (ref 0.0–0.7)
Eosinophils Relative: 2.9 % (ref 0.0–5.0)
HCT: 40.9 % (ref 36.0–46.0)
HEMOGLOBIN: 13.8 g/dL (ref 12.0–15.0)
LYMPHS PCT: 34.3 % (ref 12.0–46.0)
Lymphs Abs: 1.6 10*3/uL (ref 0.7–4.0)
MCHC: 33.8 g/dL (ref 30.0–36.0)
MCV: 87.3 fl (ref 78.0–100.0)
MONOS PCT: 12.7 % — AB (ref 3.0–12.0)
Monocytes Absolute: 0.6 10*3/uL (ref 0.1–1.0)
NEUTROS ABS: 2.3 10*3/uL (ref 1.4–7.7)
Neutrophils Relative %: 49.6 % (ref 43.0–77.0)
Platelets: 211 10*3/uL (ref 150.0–400.0)
RBC: 4.69 Mil/uL (ref 3.87–5.11)
RDW: 13.3 % (ref 11.5–15.5)
WBC: 4.7 10*3/uL (ref 4.0–10.5)

## 2014-01-07 LAB — HEPATIC FUNCTION PANEL
ALBUMIN: 3.8 g/dL (ref 3.5–5.2)
ALK PHOS: 39 U/L (ref 39–117)
ALT: 18 U/L (ref 0–35)
AST: 26 U/L (ref 0–37)
Bilirubin, Direct: 0 mg/dL (ref 0.0–0.3)
TOTAL PROTEIN: 6.7 g/dL (ref 6.0–8.3)
Total Bilirubin: 0.8 mg/dL (ref 0.2–1.2)

## 2014-01-07 LAB — VITAMIN D 25 HYDROXY (VIT D DEFICIENCY, FRACTURES): VITD: 37.78 ng/mL

## 2014-01-07 LAB — BASIC METABOLIC PANEL
BUN: 11 mg/dL (ref 6–23)
CALCIUM: 9.3 mg/dL (ref 8.4–10.5)
CO2: 27 mEq/L (ref 19–32)
CREATININE: 0.8 mg/dL (ref 0.4–1.2)
Chloride: 105 mEq/L (ref 96–112)
GFR: 84.76 mL/min (ref >60.00)
GLUCOSE: 75 mg/dL (ref 70–99)
POTASSIUM: 3.8 meq/L (ref 3.5–5.1)
Sodium: 137 mEq/L (ref 135–145)

## 2014-01-07 LAB — LIPID PANEL
CHOLESTEROL: 141 mg/dL (ref 0–200)
HDL: 69 mg/dL (ref >39.00)
LDL CALC: 62 mg/dL (ref 0–99)
NonHDL: 72
TRIGLYCERIDES: 49 mg/dL (ref 0.0–149.0)
Total CHOL/HDL Ratio: 2
VLDL: 9.8 mg/dL (ref 0.0–40.0)

## 2014-01-07 LAB — TSH: TSH: 1.78 u[IU]/mL (ref 0.35–4.50)

## 2014-01-07 NOTE — Patient Instructions (Signed)
Follow up in 1 year or as needed Keep up the good work!  You look great! We'll notify you of your lab results and make any changes if needed Call with any questions or concerns GOOD LUCK NEXT WEEK!!!

## 2014-01-07 NOTE — Progress Notes (Signed)
   Subjective:    Patient ID: Miranda Hoover, female    DOB: June 23, 1970, 43 y.o.   MRN: [id number]  HPI CPE- UTD on GYN (pap 4/30, mammo 5/4), attempting IVF.   Review of Systems Patient reports no vision/ hearing changes, adenopathy,fever, weight change,  persistant/recurrent hoarseness , swallowing issues, chest pain, palpitations, edema, persistant/recurrent cough, hemoptysis, dyspnea (rest/exertional/paroxysmal nocturnal), gastrointestinal bleeding (melena, rectal bleeding), abdominal pain, significant heartburn, bowel changes, GU symptoms (dysuria, hematuria, incontinence), Gyn symptoms (abnormal  bleeding, pain),  syncope, focal weakness, memory loss, numbness & tingling, skin/hair/nail changes, abnormal bruising or bleeding, anxiety, or depression.     Objective:   Physical Exam General Appearance:    Alert, cooperative, no distress, appears stated age  Head:    Normocephalic, without obvious abnormality, atraumatic  Eyes:    PERRL, conjunctiva/corneas clear, EOM's intact, fundi    benign, both eyes  Ears:    Normal TM's and external ear canals, both ears  Nose:   Nares normal, septum midline, mucosa normal, no drainage    or sinus tenderness  Throat:   Lips, mucosa, and tongue normal; teeth and gums normal  Neck:   Supple, symmetrical, trachea midline, no adenopathy;    Thyroid: no enlargement/tenderness/nodules  Back:     Symmetric, no curvature, ROM normal, no CVA tenderness  Lungs:     Clear to auscultation bilaterally, respirations unlabored  Chest Wall:    No tenderness or deformity   Heart:    Regular rate and rhythm, S1 and S2 normal, no murmur, rub   or gallop  Breast Exam:    Deferred to GYN  Abdomen:     Soft, non-tender, bowel sounds active all four quadrants,    no masses, no organomegaly  Genitalia:    Deferred to GYN  Rectal:    Extremities:   Extremities normal, atraumatic, no cyanosis or edema  Pulses:   2+ and symmetric all extremities  Skin:   Skin color,  texture, turgor normal, no rashes or lesions  Lymph nodes:   Cervical, supraclavicular, and axillary nodes normal  Neurologic:   CNII-XII intact, normal strength, sensation and reflexes    throughout          Assessment & Plan:

## 2014-01-07 NOTE — Progress Notes (Signed)
Pre visit review using our clinic review tool, if applicable. No additional management support is needed unless otherwise documented below in the visit note. 

## 2014-01-07 NOTE — Assessment & Plan Note (Signed)
Pt's PE WNL.  UTD on GYN.  Check labs.  Anticipatory guidance provided.  

## 2014-01-08 ENCOUNTER — Encounter: Payer: Self-pay | Admitting: General Practice

## 2014-01-08 ENCOUNTER — Encounter: Payer: Self-pay | Admitting: Family Medicine

## 2014-01-25 LAB — HM PAP SMEAR: HM Pap smear: NORMAL

## 2015-01-26 ENCOUNTER — Ambulatory Visit (INDEPENDENT_AMBULATORY_CARE_PROVIDER_SITE_OTHER): Payer: 59 | Admitting: Family Medicine

## 2015-01-26 ENCOUNTER — Encounter: Payer: Self-pay | Admitting: Family Medicine

## 2015-01-26 VITALS — BP 110/80 | HR 83 | Temp 98.0°F | Resp 16 | Ht 65.0 in | Wt 145.2 lb

## 2015-01-26 DIAGNOSIS — F32A Depression, unspecified: Secondary | ICD-10-CM

## 2015-01-26 DIAGNOSIS — F329 Major depressive disorder, single episode, unspecified: Secondary | ICD-10-CM

## 2015-01-26 MED ORDER — FLUOXETINE HCL 20 MG PO TABS: 20.0000 mg | ORAL_TABLET | Freq: Every day | ORAL | Status: DC

## 2015-01-26 NOTE — Progress Notes (Signed)
Pre visit review using our clinic review tool, if applicable. No additional management support is needed unless otherwise documented below in the visit note. 

## 2015-01-26 NOTE — Patient Instructions (Addendum)
Follow up in 3-4 weeks to recheck mood and medication Start the Prozac once daily Try and find a stress outlet- this will help your mood and you deserve it! Any transient side effects should be gone within 5 days- headaches, nausea Call with any questions or concerns Hang in there!!!

## 2015-01-26 NOTE — Progress Notes (Signed)
   Subjective:    Patient ID: Michail Jewels, female    DOB: Dec 20, 1970, 44 y.o.   MRN: [id number]  HPI Depression- recurrent problem for pt.  Pt was previously on Wellbutrin and Celexa.  Pt weaned off meds 3 yrs ago when she started fertility txs.  Pt has been 'trying to focus on having a baby but that's not working either'.  Pt felt sxs were previously well controlled on meds.  Pt reports decreased energy and motivation.  Tearful, irritable, sleeping more than usual w/ increased difficulty getting up in the AM.  No thoughts of self harm but admits to morbid thoughts.  Seeing a therapist who recommended med restart.  'everything annoys me, everything bothers me'.   Review of Systems For ROS see HPI     Objective:   Physical Exam  Constitutional: She is oriented to person, place, and time. She appears well-developed and well-nourished. No distress.  HENT:  Head: Normocephalic and atraumatic.  Eyes: Conjunctivae and EOM are normal. Pupils are equal, round, and reactive to light.  Neck: Normal range of motion. Neck supple.  Cardiovascular: Normal rate, regular rhythm, normal heart sounds and intact distal pulses.   Pulmonary/Chest: Effort normal and breath sounds normal. No respiratory distress. She has no wheezes. She has no rales.  Neurological: She is alert and oriented to person, place, and time.  Skin: Skin is warm and dry.  Psychiatric: Her behavior is normal. Thought content normal.  Tearful, obviously anxious  Vitals reviewed.         Assessment & Plan:

## 2015-01-27 NOTE — Assessment & Plan Note (Signed)
Deteriorated.  Pt is really struggling w/ her infertility and recent miscarriage.  Has decided that she wants to restart medication.  Not interested in the elevated, 'twitchy' feeling of Wellbutrin but is having difficulty w/ motivation and energy.  Based on this, will start Prozac which is typically more activating than her previous Celexa.  Applauded her decision to seek counseling.  Will continue to follow closely.  Pt expressed understanding and is in agreement w/ plan.

## 2015-02-27 ENCOUNTER — Ambulatory Visit (INDEPENDENT_AMBULATORY_CARE_PROVIDER_SITE_OTHER): Payer: 59 | Admitting: Family Medicine

## 2015-02-27 ENCOUNTER — Encounter: Payer: Self-pay | Admitting: Family Medicine

## 2015-02-27 VITALS — BP 110/80 | HR 67 | Temp 98.2°F | Resp 16 | Ht 65.0 in | Wt 140.2 lb

## 2015-02-27 DIAGNOSIS — Z23 Encounter for immunization: Secondary | ICD-10-CM | POA: Diagnosis not present

## 2015-02-27 DIAGNOSIS — F329 Major depressive disorder, single episode, unspecified: Secondary | ICD-10-CM | POA: Diagnosis not present

## 2015-02-27 DIAGNOSIS — F32A Depression, unspecified: Secondary | ICD-10-CM

## 2015-02-27 MED ORDER — FLUTICASONE PROPIONATE 50 MCG/ACT NA SUSP: NASAL | Status: DC

## 2015-02-27 MED ORDER — FLUOXETINE HCL 20 MG PO TABS: 20.0000 mg | ORAL_TABLET | Freq: Every day | ORAL | Status: DC

## 2015-02-27 NOTE — Progress Notes (Signed)
   Subjective:    Patient ID: Miranda Hoover, female    DOB: [birth year], 44 y.o.   MRN: [id number]  HPI Depression- started on prozac.  'better'.  Tearfulness has improved.  Short temper has improved.  Husband feels meds are helpful- no longer 'snappy'.  Sleep has also improved.  Increase in HAs recently.  HAs respond to excedrin.  Continues to have stress at work.   Review of Systems For ROS see HPI     Objective:   Physical Exam  Constitutional: She is oriented to person, place, and time. She appears well-developed and well-nourished. No distress.  HENT:  Head: Normocephalic and atraumatic.  Eyes: Conjunctivae and EOM are normal. Pupils are equal, round, and reactive to light.  Neurological: She is alert and oriented to person, place, and time.  Skin: Skin is warm and dry.  Psychiatric: She has a normal mood and affect. Her behavior is normal. Thought content normal.  Vitals reviewed.         Assessment & Plan:

## 2015-02-27 NOTE — Assessment & Plan Note (Signed)
Improved since starting Prozac.  Denies side effects.  Husband feels meds have made improvement as does her therapist.  No med changes at this time.  Prescription sent to mail order.

## 2015-02-27 NOTE — Patient Instructions (Signed)
Schedule your complete physical at your convenience Continue the Prozac daily- you're doing great! Call with any questions or concerns If you want to join [place] at the new [place] office, any scheduled appointments will automatically transfer and we will see you at [address] [place] [address] [place], [place] [address]  Have a great fall season!!!

## 2015-02-27 NOTE — Progress Notes (Signed)
Pre visit review using our clinic review tool, if applicable. No additional management support is needed unless otherwise documented below in the visit note. 

## 2015-03-13 ENCOUNTER — Telehealth: Payer: Self-pay | Admitting: Family Medicine

## 2015-03-13 NOTE — Telephone Encounter (Signed)
Called and informed pt, she will contact OB on Monday if still feeling bad.

## 2015-03-13 NOTE — Telephone Encounter (Signed)
No need for testing unless travelled to specific areas of [place] (where did she go?) and/or she had known mosquito bites

## 2015-03-13 NOTE — Telephone Encounter (Signed)
Relation to pt: self Call back number: [phone]   Reason for call:  Patient just returned back from [place], she is concerned if she should be tested for Zika Virus. Patient is not exp. Any symptoms. Please advise

## 2015-03-13 NOTE — Telephone Encounter (Signed)
No bug bites, pt flew and feels it is more allergy related.

## 2015-04-21 LAB — HM MAMMOGRAPHY: HM MAMMO: NORMAL

## 2015-04-21 LAB — HM PAP SMEAR: HM Pap smear: NORMAL

## 2015-06-17 ENCOUNTER — Encounter: Payer: Self-pay | Admitting: Family Medicine

## 2015-07-13 ENCOUNTER — Encounter: Payer: 59 | Admitting: Family Medicine

## 2015-07-14 NOTE — Telephone Encounter (Signed)
Pt has CPE scheduled on 07/20/2015 at 10:00, we can use that as new Pt appt and CPE can still be completed on 02/27/2016 if she would like.

## 2015-07-14 NOTE — Telephone Encounter (Signed)
Patient contacted and stated due to Dr. Beverely Low relocating she would like to establish with Dr. Drue Novel. Patient made the appointment 02/27/16 and states she would like her physical and labs done that day. Informed patient it will be a new patient appointment and it would be at the provider discretion if a physical would be conducted and patient insisted on having a physical done that day due to insurance reasons.

## 2015-07-17 ENCOUNTER — Encounter: Payer: Self-pay | Admitting: *Deleted

## 2015-07-17 ENCOUNTER — Telehealth: Payer: Self-pay | Admitting: *Deleted

## 2015-07-17 NOTE — Telephone Encounter (Signed)
Pre-Visit Call completed with patient and chart updated.   Pre-Visit Info documented in Specialty Comments under [employer].    

## 2015-07-20 ENCOUNTER — Encounter: Payer: Self-pay | Admitting: Internal Medicine

## 2015-07-20 ENCOUNTER — Ambulatory Visit (INDEPENDENT_AMBULATORY_CARE_PROVIDER_SITE_OTHER): Payer: 59 | Admitting: Internal Medicine

## 2015-07-20 VITALS — BP 98/60 | HR 79 | Temp 97.8°F | Ht 65.0 in | Wt 144.6 lb

## 2015-07-20 DIAGNOSIS — Z23 Encounter for immunization: Secondary | ICD-10-CM

## 2015-07-20 DIAGNOSIS — Z0000 Encounter for general adult medical examination without abnormal findings: Secondary | ICD-10-CM

## 2015-07-20 LAB — COMPREHENSIVE METABOLIC PANEL
ALT: 15 U/L (ref 0–35)
AST: 25 U/L (ref 0–37)
Albumin: 4.1 g/dL (ref 3.5–5.2)
Alkaline Phosphatase: 45 U/L (ref 39–117)
BILIRUBIN TOTAL: 0.5 mg/dL (ref 0.2–1.2)
BUN: 16 mg/dL (ref 6–23)
CHLORIDE: 105 meq/L (ref 96–112)
CO2: 27 meq/L (ref 19–32)
Calcium: 8.9 mg/dL (ref 8.4–10.5)
Creatinine, Ser: 0.69 mg/dL (ref 0.40–1.20)
GFR: 98.38 mL/min (ref >60.00)
GLUCOSE: 84 mg/dL (ref 70–99)
Potassium: 4.1 mEq/L (ref 3.5–5.1)
Sodium: 137 mEq/L (ref 135–145)
Total Protein: 7 g/dL (ref 6.0–8.3)

## 2015-07-20 LAB — LIPID PANEL
CHOL/HDL RATIO: 2
Cholesterol: 153 mg/dL (ref 0–200)
HDL: 74.7 mg/dL (ref >39.00)
LDL CALC: 71 mg/dL (ref 0–99)
NONHDL: 77.85
Triglycerides: 33 mg/dL (ref 0.0–149.0)
VLDL: 6.6 mg/dL (ref 0.0–40.0)

## 2015-07-20 LAB — VITAMIN D 25 HYDROXY (VIT D DEFICIENCY, FRACTURES): VITD: 26.08 ng/mL — ABNORMAL LOW (ref 30.00–100.00)

## 2015-07-20 NOTE — Patient Instructions (Signed)
BEFORE YOU LEAVE THE OFFICE:  GO TO THE LAB : Get the blood work    GO TO THE FRONT DESK Schedule a routine office visit or check up to be done in  8-9 months  No  fasting  Front desk:  15

## 2015-07-20 NOTE — Progress Notes (Signed)
Subjective:    Patient ID: Miranda Hoover, female    DOB: 1971/05/26, 45 y.o.   MRN: [id number]  DOS:  07/20/2015 Type of visit - description : CPX, new patient to me. Interval history: Good compliance with medication, feeling well.    Review of Systems  Constitutional: No fever. No chills. No unexplained wt changes. No unusual sweats  HEENT: No dental problems, no ear discharge, no facial swelling, no voice changes. No eye discharge, no eye  redness , no  intolerance to light   Respiratory: No wheezing , no  difficulty breathing. No cough , no mucus production  Cardiovascular: No CP, no leg swelling , no  Palpitations  GI: no nausea, no vomiting, no diarrhea , no  abdominal pain.  No blood in the stools. No dysphagia, no odynophagia    Endocrine: No polyphagia, no polyuria , no polydipsia  GU: No dysuria, gross hematuria, difficulty urinating. No urinary urgency, no frequency.  Musculoskeletal: No joint swellings or unusual aches or pains  Skin: No change in the color of the skin, palor , no  Rash  Allergic, immunologic: Chronic allergies, well-controlled with medications  Neurological: No dizziness no  syncope. No headaches. No diplopia, no slurred, no slurred speech, no motor deficits, no facial  Numbness  Hematological: No enlarged lymph nodes, no easy bruising , no unusual bleedings  Psychiatry: No suicidal ideas, no hallucinations, no beavior problems, no confusion.  Depression well-controlled with SSRIs. Past Medical History  Diagnosis Date  . Depression   . Frequent episodic tension-type headache   . Sinus polyp     Past Surgical History  Procedure Laterality Date  . Tonsillectomy and adenoidectomy    . Nasal sinus surgery      x3  . Ovarian cyst surgery    . Bunionectomy Right 2013    Social History   Social History  . Marital Status: Single    Spouse Name: N/A  . Number of Children: 0  . Years of Education: N/A   Occupational History  .  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 Smoking status: Never Smoker   . Smokeless tobacco: Not on file  . Alcohol Use: Yes  . Drug Use: No  . Sexual Activity: Not on file   Other Topics Concern  . Not on file   Social History Narrative   Married, no children, issue w/ infertility ?IVF     Family History  Problem Relation Age of Onset  . Arthritis Father   . Arthritis Maternal Grandmother   . Uterine cancer Maternal Grandmother   . Hyperlipidemia Father   . Heart disease Maternal Grandmother   . Stroke Paternal Grandmother   . Hypertension Father   . Hypertension Maternal Grandmother   . Hypertension Paternal Grandmother   . Diabetes Father   . Diabetes Paternal Grandmother   . Diabetes Paternal Aunt   . Diabetes Paternal Uncle   . Colon cancer Neg Hx   . Breast cancer Neg Hx        Medication List       This list is accurate as of: 07/20/15  5:37 PM.  Always use your most recent med list.               cetirizine 10 MG tablet  Commonly known as:  ZYRTEC  Take 10 mg by mouth as needed.     FLUoxetine 20 MG tablet  Commonly known as:  PROZAC  Take  1 tablet (20 mg total) by mouth daily.     fluticasone 50 MCG/ACT nasal spray  Commonly known as:  FLONASE  USE 2 SPRAYS NASALLY DAILY           Objective:   Physical Exam BP 98/60 mmHg  Pulse 79  Temp(Src) 97.8 F (36.6 C) (Oral)  Ht  (1.651 m)  Wt 144 lb 9.6 oz (65.59 kg)  BMI 24.06 kg/m2  SpO2 99% General:   Well developed, well nourished . NAD.  HEENT:  Normocephalic . Face symmetric, atraumatic. Neck: No thyromegaly Lungs:  CTA B Normal respiratory effort, no intercostal retractions, no accessory muscle use. Heart: RRR,  no murmur.  no pretibial edema bilaterally  Abdomen:  Not distended, soft, non-tender. No rebound or rigidity.  Skin: Not pale. Not jaundice Neurologic:  alert & oriented X3.  Speech normal, gait appropriate for age and unassisted Psych--    Cognition and judgment appear intact.  Cooperative with normal attention span and concentration.  Behavior appropriate. No anxious or depressed appearing.    Assessment & Plan:   Assessment Depression Headaches, tensional Allergic rhinitis Infertility issues, sees gynecology  PLAN Depression: Well controlled on SSRIs RTC 8-9 months, ROV

## 2015-07-20 NOTE — Assessment & Plan Note (Signed)
Td - today Had Flu shot  Female care : DR Cherie Ouch, Nestor Ramp , 04-2015: nl PAP-[facility] Colon ca screening: no indicated  Diet-exercise discussed . She started to exercise recently. Diet is usually healthy

## 2015-07-20 NOTE — Progress Notes (Signed)
Pre visit review using our clinic review tool, if applicable. No additional management support is needed unless otherwise documented below in the visit note. 

## 2015-07-21 MED ORDER — VITAMIN D (ERGOCALCIFEROL) 1.25 MG (50000 UNIT) PO CAPS: 50000.0000 [IU] | ORAL_CAPSULE | ORAL | Status: DC

## 2015-07-21 NOTE — Addendum Note (Signed)
Addended byConrad  D on: 07/21/2015 10:15 AM   Modules accepted: Orders

## 2015-08-18 ENCOUNTER — Ambulatory Visit (INDEPENDENT_AMBULATORY_CARE_PROVIDER_SITE_OTHER): Payer: 59 | Admitting: Physician Assistant

## 2015-08-18 ENCOUNTER — Encounter: Payer: Self-pay | Admitting: Physician Assistant

## 2015-08-18 VITALS — BP 118/72 | HR 73 | Temp 99.9°F | Ht 65.0 in | Wt 145.0 lb

## 2015-08-18 DIAGNOSIS — J101 Influenza due to other identified influenza virus with other respiratory manifestations: Secondary | ICD-10-CM | POA: Diagnosis not present

## 2015-08-18 DIAGNOSIS — R509 Fever, unspecified: Secondary | ICD-10-CM | POA: Diagnosis not present

## 2015-08-18 LAB — POCT INFLUENZA A/B
INFLUENZA B, POC: POSITIVE — AB
Influenza A, POC: POSITIVE — AB

## 2015-08-18 MED ORDER — AZELASTINE HCL 0.1 % NA SOLN: 2.0000 | Freq: Two times a day (BID) | NASAL | Status: DC

## 2015-08-18 MED ORDER — BENZONATATE 100 MG PO CAPS: 100.0000 mg | ORAL_CAPSULE | Freq: Two times a day (BID) | ORAL | Status: DC | PRN

## 2015-08-18 MED ORDER — OSELTAMIVIR PHOSPHATE 75 MG PO CAPS: 75.0000 mg | ORAL_CAPSULE | Freq: Two times a day (BID) | ORAL | Status: DC

## 2015-08-18 NOTE — Progress Notes (Signed)
Patient presents to clinic today c/o 1.5 days of fever, headaches, body aches and nasal congestion. Endorses temperature of 101 at highest. Endorses rhinorrhea and chills. Endorses PND. Denies chest pain or SOB. Denies recent travel or sick contact. Has had flu shot.   Past Medical History  Diagnosis Date  . Depression   . Frequent episodic tension-type headache   . Sinus polyp     Current Outpatient Prescriptions on File Prior to Visit  Medication Sig Dispense Refill  . cetirizine (ZYRTEC) 10 MG tablet Take 10 mg by mouth as needed.      Marland Kitchen FLUoxetine (PROZAC) 20 MG tablet Take 1 tablet (20 mg total) by mouth daily. 90 tablet 3  . Vitamin D, Ergocalciferol, (DRISDOL) 50000 units CAPS capsule Take 1 capsule (50,000 Units total) by mouth every 7 (seven) days. For 12 weeks. 12 capsule 0   No current facility-administered medications on file prior to visit.    Allergies  Allergen Reactions  . Amitriptyline Rash    Family History  Problem Relation Age of Onset  . Arthritis Father   . Arthritis Maternal Grandmother   . Uterine cancer Maternal Grandmother   . Hyperlipidemia Father   . Heart disease Maternal Grandmother   . Stroke Paternal Grandmother   . Hypertension Father   . Hypertension Maternal Grandmother   . Hypertension Paternal Grandmother   . Diabetes Father   . Diabetes Paternal Grandmother   . Diabetes Paternal Aunt   . Diabetes Paternal Uncle   . Colon cancer Neg Hx   . Breast cancer Neg Hx     Social History   Social History  . Marital Status: Single    Spouse Name: N/A  . Number of Children: 0  . Years of Education: N/A   Occupational History  . 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 Smoking status: Never Smoker   . Smokeless tobacco: None  . Alcohol Use: Yes  . Drug Use: No  . Sexual Activity: Not Asked   Other Topics Concern  . None   Social History Narrative   Married, no children, issue w/ infertility ?IVF    Review  of Systems - See HPI.  All other ROS are negative.  BP 118/72 mmHg  Pulse 73  Temp(Src) 99.9 F (37.7 C) (Oral)  Ht  (1.651 m)  Wt 145 lb (65.772 kg)  BMI 24.13 kg/m2  SpO2 97%  Physical Exam  Constitutional: She is oriented to person, place, and time and well-developed, well-nourished, and in no distress.  HENT:  Head: Normocephalic and atraumatic.  Right Ear: External ear normal.  Left Ear: External ear normal.  Nose: Nose normal.  Mouth/Throat: Oropharynx is clear and moist. No oropharyngeal exudate.  Eyes: Conjunctivae are normal.  Cardiovascular: Normal rate, regular rhythm, normal heart sounds and intact distal pulses.   Pulmonary/Chest: Effort normal and breath sounds normal. No respiratory distress. She has no wheezes. She has no rales. She exhibits no tenderness.  Neurological: She is alert and oriented to person, place, and time.  Skin: Skin is warm and dry. No rash noted.  Psychiatric: Affect normal.  Vitals reviewed.   Recent Results (from the past 2160 hour(s))  Comprehensive metabolic panel     Status: None   Collection Time: 07/20/15 10:37 AM  Result Value Ref Range   Sodium 137 135 - 145 mEq/L   Potassium 4.1 3.5 - 5.1 mEq/L   Chloride 105 96 - 112 mEq/L  CO2 27 19 - 32 mEq/L   Glucose, Bld 84 70 - 99 mg/dL   BUN 16 6 - 23 mg/dL   Creatinine, Ser 1.61 0.40 - 1.20 mg/dL   Total Bilirubin 0.5 0.2 - 1.2 mg/dL   Alkaline Phosphatase 45 39 - 117 U/L   AST 25 0 - 37 U/L   ALT 15 0 - 35 U/L   Total Protein 7.0 6.0 - 8.3 g/dL   Albumin 4.1 3.5 - 5.2 g/dL   Calcium 8.9 8.4 - 09.6 mg/dL   GFR 04.54 >09.81 mL/min  Lipid panel     Status: None   Collection Time: 07/20/15 10:37 AM  Result Value Ref Range   Cholesterol 153 0 - 200 mg/dL    Comment: ATP III Classification       Desirable:  < 200 mg/dL               Borderline High:  200 - 239 mg/dL          High:  > = 191 mg/dL   Triglycerides 47.8 0.0 - 149.0 mg/dL    Comment: Normal:  <295 mg/dLBorderline  High:  150 - 199 mg/dL   HDL 62.13 >08.65 mg/dL   VLDL 6.6 0.0 - 78.4 mg/dL   LDL Cholesterol 71 0 - 99 mg/dL   Total CHOL/HDL Ratio 2     Comment:                Men          Women1/2 Average Risk     3.4          3.3Average Risk          5.0          4.42X Average Risk          9.6          7.13X Average Risk          15.0          11.0                       NonHDL 77.85     Comment: NOTE:  Non-HDL goal should be 30 mg/dL higher than patient's LDL goal (i.e. LDL goal of < 70 mg/dL, would have non-HDL goal of < 100 mg/dL)  VITAMIN D 25 Hydroxy (Vit-D Deficiency, Fractures)     Status: Abnormal   Collection Time: 07/20/15 10:37 AM  Result Value Ref Range   VITD 26.08 (L) 30.00 - 100.00 ng/mL  POCT Influenza A/B     Status: Abnormal   Collection Time: 08/18/15  3:25 PM  Result Value Ref Range   Influenza A, POC Positive (A) Negative   Influenza B, POC Positive (A) Negative    Assessment/Plan: Influenza A Flu +. Less then 48 hours since symptom onset. Rx Tamiflu and Tessalon. Supportive measures and OTC measures reviewed. Follow-up prn if symptoms are not resolving.

## 2015-08-18 NOTE — Assessment & Plan Note (Signed)
Flu +. Less then 48 hours since symptom onset. Rx Tamiflu and Tessalon. Supportive measures and OTC measures reviewed. Follow-up prn if symptoms are not resolving.

## 2015-08-18 NOTE — Patient Instructions (Signed)
Please take the Tamiflu as directed. Increase fluids and get plenty of rest. Astelin as directed. Use Tessalon as directed for cough. Use Tylenol if needed for fevers and aches.  Influenza, Adult Influenza ("the flu") is a viral infection of the respiratory tract. It occurs more often in winter months because people spend more time in close contact with one another. Influenza can make you feel very sick. Influenza easily spreads from person to person (contagious). CAUSES  Influenza is caused by a virus that infects the respiratory tract. You can catch the virus by breathing in droplets from an infected person's cough or sneeze. You can also catch the virus by touching something that was recently contaminated with the virus and then touching your mouth, nose, or eyes. RISKS AND COMPLICATIONS You may be at risk for a more severe case of influenza if you smoke cigarettes, have diabetes, have chronic heart disease (such as heart failure) or lung disease (such as asthma), or if you have a weakened immune system. Elderly people and pregnant women are also at risk for more serious infections. The most common problem of influenza is a lung infection (pneumonia). Sometimes, this problem can require emergency medical care and may be life threatening. SIGNS AND SYMPTOMS  Symptoms typically last 4 to 10 days and may include:  Fever.  Chills.  Headache, body aches, and muscle aches.  Sore throat.  Chest discomfort and cough.  Poor appetite.  Weakness or feeling tired.  Dizziness.  Nausea or vomiting. DIAGNOSIS  Diagnosis of influenza is often made based on your history and a physical exam. A nose or throat swab test can be done to confirm the diagnosis. TREATMENT  In mild cases, influenza goes away on its own. Treatment is directed at relieving symptoms. For more severe cases, your health care provider may prescribe antiviral medicines to shorten the sickness. Antibiotic medicines are not  effective because the infection is caused by a virus, not by bacteria. HOME CARE INSTRUCTIONS  Take medicines only as directed by your health care provider.  Use a cool mist humidifier to make breathing easier.  Get plenty of rest until your temperature returns to normal. This usually takes 3 to 4 days.  Drink enough fluid to keep your urine clear or pale yellow.  Cover yourmouth and nosewhen coughing or sneezing,and wash your handswellto prevent thevirusfrom spreading.  Stay homefromwork orschool untilthe fever is gonefor at least 35full day. PREVENTION  An annual influenza vaccination (flu shot) is the best way to avoid getting influenza. An annual flu shot is now routinely recommended for all adults in the [place] SEEK MEDICAL CARE IF:  You experiencechest pain, yourcough worsens,or you producemore mucus.  Youhave nausea,vomiting, ordiarrhea.  Your fever returns or gets worse. SEEK IMMEDIATE MEDICAL CARE IF:  You havetrouble breathing, you become short of breath,or your skin ornails becomebluish.  You have severe painor stiffnessin the neck.  You develop a sudden headache, or pain in the face or ear.  You have nausea or vomiting that you cannot control. MAKE SURE YOU:   Understand these instructions.  Will watch your condition.  Will get help right away if you are not doing well or get worse.   This information is not intended to replace advice given to you by your health care provider. Make sure you discuss any questions you have with your health care provider.   Document Released: 06/03/2000 Document Revised: 06/27/2014 Document Reviewed: 09/05/2011 Elsevier Interactive Patient Education [employer]

## 2015-08-18 NOTE — Progress Notes (Signed)
Pre visit review using our clinic review tool, if applicable. No additional management support is needed unless otherwise documented below in the visit note. 

## 2015-10-22 ENCOUNTER — Other Ambulatory Visit: Payer: Self-pay | Admitting: Internal Medicine

## 2016-04-17 ENCOUNTER — Other Ambulatory Visit: Payer: Self-pay | Admitting: Family Medicine

## 2016-05-10 ENCOUNTER — Ambulatory Visit (INDEPENDENT_AMBULATORY_CARE_PROVIDER_SITE_OTHER): Payer: 59

## 2016-05-10 DIAGNOSIS — Z23 Encounter for immunization: Secondary | ICD-10-CM

## 2016-07-10 ENCOUNTER — Other Ambulatory Visit: Payer: Self-pay | Admitting: Internal Medicine

## 2016-08-01 ENCOUNTER — Ambulatory Visit (INDEPENDENT_AMBULATORY_CARE_PROVIDER_SITE_OTHER): Payer: 59 | Admitting: Internal Medicine

## 2016-08-01 ENCOUNTER — Encounter: Payer: Self-pay | Admitting: Internal Medicine

## 2016-08-01 VITALS — BP 118/76 | HR 76 | Temp 98.1°F | Resp 14 | Ht 65.0 in | Wt 157.5 lb

## 2016-08-01 DIAGNOSIS — Z09 Encounter for follow-up examination after completed treatment for conditions other than malignant neoplasm: Secondary | ICD-10-CM | POA: Insufficient documentation

## 2016-08-01 DIAGNOSIS — Z0000 Encounter for general adult medical examination without abnormal findings: Secondary | ICD-10-CM | POA: Diagnosis not present

## 2016-08-01 DIAGNOSIS — N912 Amenorrhea, unspecified: Secondary | ICD-10-CM | POA: Diagnosis not present

## 2016-08-01 LAB — HCG, QUANTITATIVE, PREGNANCY: QUANTITATIVE HCG: 0.61 m[IU]/mL

## 2016-08-01 MED ORDER — FLUOXETINE HCL 20 MG PO CAPS: 20.0000 mg | ORAL_CAPSULE | Freq: Every day | ORAL | 3 refills | Status: DC

## 2016-08-01 NOTE — Progress Notes (Signed)
Pre visit review using our clinic review tool, if applicable. No additional management support is needed unless otherwise documented below in the visit note. 

## 2016-08-01 NOTE — Progress Notes (Signed)
Subjective:    Patient ID: Miranda Hoover, female    DOB: [birth year], 46 y.o.   MRN: [id number]  DOS:  08/01/2016 Type of visit - description : cpx Interval history: Has a very healthy lifestyle, active, eating healthy.    Review of Systems Constitutional: No fever. No chills. No unexplained wt changes. No unusual sweats  HEENT: No dental problems, no ear discharge, no facial swelling, no voice changes. No eye discharge, no eye  redness , no  intolerance to light   Respiratory: No wheezing , no  difficulty breathing. No cough , no mucus production  Cardiovascular: No CP, no leg swelling , no  Palpitations  GI: no nausea, no vomiting, no diarrhea , no  abdominal pain.  No blood in the stools. No dysphagia, no odynophagia    Endocrine: No polyphagia, no polyuria , no polydipsia  GU: Undergoing infertility treatment with IVF, last menstrual period before Christmas, request a pregnancy test. Denies any vaginal bleeding or abdominal cramping. Occasionally has nausea, not new symptom.  No dysuria, gross hematuria, difficulty urinating. No urinary urgency, no frequency.  Musculoskeletal: Occasionally has some left shoulder discomfort, minimal but has noted the range of motion is slightly decreased.  Skin: No change in the color of the skin, palor , no  Rash  Allergic, immunologic: No environmental allergies , no  food allergies  Neurological: No dizziness no  syncope. No headaches. No diplopia, no slurred, no slurred speech, no motor deficits, no facial  Numbness  Hematological: No enlarged lymph nodes, no easy bruising , no unusual bleedings  Psychiatry: No suicidal ideas, no hallucinations, no beavior problems, no confusion.  No unusual/severe anxiety, no depression   Past Medical History:  Diagnosis Date  . Depression   . Frequent episodic tension-type headache   . Sinus polyp     Past Surgical History:  Procedure Laterality Date  . BUNIONECTOMY Right 2013  .  NASAL SINUS SURGERY     x3  . OVARIAN CYST SURGERY    . TONSILLECTOMY AND ADENOIDECTOMY      Social History   Social History  . Marital status: Single    Spouse name: N/A  . Number of children: 0  . Years of education: N/A   Occupational History  . 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 Smoking status: Never Smoker  . Smokeless tobacco: Never Used  . Alcohol use Yes  . Drug use: No  . Sexual activity: Not on file   Other Topics Concern  . Not on file   Social History Narrative   Married, no children, issue w/ infertility ?IVF     Family History  Problem Relation Age of Onset  . Arthritis Father   . Hyperlipidemia Father   . Hypertension Father   . Diabetes Father   . Arthritis Maternal Grandmother   . Uterine cancer Maternal Grandmother   . Heart disease Maternal Grandmother   . Hypertension Maternal Grandmother   . Stroke Paternal Grandmother   . Hypertension Paternal Grandmother   . Diabetes Paternal Grandmother   . Diabetes Paternal Aunt   . Diabetes Paternal Uncle   . Colon cancer Neg Hx   . Breast cancer Neg Hx      Allergies as of 08/01/2016      Reactions   Amitriptyline Rash      Medication List       Accurate as of 08/01/16  1:48 PM. Always use your most recent  med list.          azelastine 0.1 % nasal spray Commonly known as:  ASTELIN Place 2 sprays into both nostrils 2 (two) times daily. Use in each nostril as directed   benzonatate 100 MG capsule Commonly known as:  TESSALON Take 1 capsule (100 mg total) by mouth 2 (two) times daily as needed for cough.   cetirizine 10 MG tablet Commonly known as:  ZYRTEC Take 10 mg by mouth as needed.   FLUoxetine 20 MG capsule Commonly known as:  PROZAC Take 1 capsule (20 mg total) by mouth daily.   fluticasone 50 MCG/ACT nasal spray Commonly known as:  FLONASE Place 2 sprays into both nostrils daily. USE 2 SPRAYS NASALLY DAILY   oseltamivir 75 MG capsule Commonly known  as:  TAMIFLU Take 1 capsule (75 mg total) by mouth 2 (two) times daily.   Vitamin D (Ergocalciferol) 50000 units Caps capsule Commonly known as:  DRISDOL Take 1 capsule (50,000 Units total) by mouth every 7 (seven) days. For 12 weeks.          Objective:   Physical Exam BP 118/76 (BP Location: Left Arm, Patient Position: Sitting, Cuff Size: Small)   Pulse 76   Temp 98.1 F (36.7 C) (Oral)   Resp 14   Ht 5\' 5"  (1.651 m)   Wt 157 lb 8 oz (71.4 kg)   LMP 06/10/2016 (Approximate)   SpO2 98%   BMI 26.21 kg/m   General:   Well developed, well nourished . NAD.  Neck: No  thyromegaly  HEENT:  Normocephalic . Face symmetric, atraumatic Lungs:  CTA B Normal respiratory effort, no intercostal retractions, no accessory muscle use. Heart: RRR,  no murmur.  No pretibial edema bilaterally  Abdomen:  Not distended, soft, non-tender. No rebound or rigidity.   Skin: Exposed areas without rash. Not pale. Not jaundice MSK: Shoulders symmetric, range of motion: Minimal decrease on the left but not painful. Neurologic:  alert & oriented X3.  Speech normal, gait appropriate for age and unassisted Strength symmetric and appropriate for age.  Psych: Cognition and judgment appear intact.  Cooperative with normal attention span and concentration.  Behavior appropriate. No anxious or depressed appearing.    Assessment & Plan:    Assessment Depression Headaches, tensional Allergic rhinitis Infertility issues, sees gynecology  PLAN Depression: Well controlled on SSRIs (gyn  aware she is on SSRIs) Infertility: Undergoing IVF, last menstrual period before Christmas, request a pregnancy test. Unable to get a UPT - we couldn't get the controls to work. Will get a quantitative beta-hCG. Minimal decrease of the ROM, left shoulder with no pain essentially. Offered PT but states sx not severe enough to warrant any intervention. She will let me know when ready RTC one year, CPX

## 2016-08-01 NOTE — Assessment & Plan Note (Signed)
Td 2017 Had Flu shot  Female care : per gyn Colon ca screening: no indicated  Diet-exercise discussed, she is doing great. Also recommend to continue prenatal vitamins, calcium, vitamin D. Labs reviewed: Check a FLP, TSH, CBC, vitamin D.

## 2016-08-01 NOTE — Assessment & Plan Note (Signed)
Depression: Well controlled on SSRIs (gyn  aware she is on SSRIs) Infertility: Undergoing IVF, last menstrual period before Christmas, request a pregnancy test. Unable to get a UPT - we couldn't get the controls to work. Will get a quantitative beta-hCG. Minimal decrease of the ROM, left shoulder with no pain essentially. Offered PT but states sx not severe enough to warrant any intervention. She will let me know when ready RTC one year, CPX

## 2016-08-01 NOTE — Patient Instructions (Signed)
  GO TO THE FRONT DESK Schedule your next appointment for a  Physical exam in 1 year   Schedule labs to be done fasting at your earliest convenience

## 2016-08-02 ENCOUNTER — Other Ambulatory Visit (INDEPENDENT_AMBULATORY_CARE_PROVIDER_SITE_OTHER): Payer: 59

## 2016-08-02 DIAGNOSIS — R7989 Other specified abnormal findings of blood chemistry: Secondary | ICD-10-CM

## 2016-08-02 DIAGNOSIS — Z0000 Encounter for general adult medical examination without abnormal findings: Secondary | ICD-10-CM | POA: Diagnosis not present

## 2016-08-02 NOTE — Addendum Note (Signed)
Addended by: Harley AltoPRICE, KRISTY M on: 08/02/2016 08:28 AM   Modules accepted: Orders

## 2016-08-03 ENCOUNTER — Telehealth: Payer: Self-pay | Admitting: Internal Medicine

## 2016-08-03 NOTE — Telephone Encounter (Signed)
Spoke w/ Pt, informed of negative blood pregnancy test. Informed to call infertility doctor regarding late menstrual period. Pt verbalized understanding.

## 2016-08-03 NOTE — Telephone Encounter (Signed)
Relation to KG:MWNUpt:self Call back [phone] Pharmacy:  Reason for call:  Patient inquiring about lab results and states she cant access My Chart at this time and would prefer a phone call, please advise

## 2016-08-07 LAB — CBC WITH DIFFERENTIAL/PLATELET
BASOS ABS: 0 10*3/uL (ref 0.0–0.2)
BASOS: 1 %
EOS (ABSOLUTE): 0.2 10*3/uL (ref 0.0–0.4)
Eos: 5 %
Hematocrit: 34.6 % (ref 34.0–46.6)
Hemoglobin: 11.4 g/dL (ref 11.1–15.9)
IMMATURE GRANS (ABS): 0 10*3/uL (ref 0.0–0.1)
IMMATURE GRANULOCYTES: 0 %
LYMPHS: 31 %
Lymphocytes Absolute: 1.3 10*3/uL (ref 0.7–3.1)
MCH: 28.5 pg (ref 26.6–33.0)
MCHC: 32.9 g/dL (ref 31.5–35.7)
MCV: 87 fL (ref 79–97)
Monocytes Absolute: 0.6 10*3/uL (ref 0.1–0.9)
Monocytes: 14 %
NEUTROS PCT: 49 %
Neutrophils Absolute: 2.1 10*3/uL (ref 1.4–7.0)
PLATELETS: 339 10*3/uL (ref 150–379)
RBC: 4 x10E6/uL (ref 3.77–5.28)
RDW: 14.3 % (ref 12.3–15.4)
WBC: 4.1 10*3/uL (ref 3.4–10.8)

## 2016-08-07 LAB — VITAMIN D 1,25 DIHYDROXY
VITAMIN D3 1, 25 (OH): 50 pg/mL
Vitamin D 1, 25 (OH)2 Total: 53 pg/mL
Vitamin D2 1, 25 (OH)2: <10 pg/mL

## 2016-08-07 LAB — LIPID PANEL
CHOLESTEROL TOTAL: 155 mg/dL (ref 100–199)
Chol/HDL Ratio: 1.8 ratio units (ref 0.0–4.4)
HDL: 87 mg/dL (ref >39)
LDL Calculated: 61 mg/dL (ref 0–99)
TRIGLYCERIDES: 34 mg/dL (ref 0–149)
VLDL CHOLESTEROL CAL: 7 mg/dL (ref 5–40)

## 2016-08-07 LAB — TSH: TSH: 4.61 u[IU]/mL — ABNORMAL HIGH (ref 0.450–4.500)

## 2016-08-09 NOTE — Addendum Note (Signed)
Addended byConrad Rives: Laurinda Carreno D on: 08/09/2016 05:20 PM   Modules accepted: Orders

## 2016-10-04 ENCOUNTER — Encounter: Payer: Self-pay | Admitting: Internal Medicine

## 2016-10-04 ENCOUNTER — Telehealth: Payer: Self-pay | Admitting: Internal Medicine

## 2016-10-04 DIAGNOSIS — M25512 Pain in left shoulder: Secondary | ICD-10-CM

## 2016-10-04 NOTE — Telephone Encounter (Signed)
Caller name: Relationship to patient: Self Can be reached: [phone]  Pharmacy:  Reason for call: Request referral to Physical Therapy for shoulder pain. Discussed it with provider.

## 2016-10-04 NOTE — Telephone Encounter (Signed)
PT referral placed.  Called patient and left a detailed message on voice mail making her aware.

## 2016-10-04 NOTE — Telephone Encounter (Signed)
Please advise 

## 2016-10-04 NOTE — Telephone Encounter (Signed)
Ok, could you please place referral and notify patient?

## 2016-10-06 ENCOUNTER — Ambulatory Visit: Payer: 59 | Attending: Family | Admitting: Physical Therapy

## 2016-10-06 DIAGNOSIS — M25612 Stiffness of left shoulder, not elsewhere classified: Secondary | ICD-10-CM | POA: Diagnosis present

## 2016-10-06 DIAGNOSIS — M6281 Muscle weakness (generalized): Secondary | ICD-10-CM | POA: Insufficient documentation

## 2016-10-06 DIAGNOSIS — M25512 Pain in left shoulder: Secondary | ICD-10-CM | POA: Insufficient documentation

## 2016-10-06 NOTE — Therapy (Signed)
[facility] [address], [address] [place], [place], [address] Phone: [phone]   Fax:  [phone]  Physical Therapy Evaluation  Patient Details  Name: Miranda Hoover MRN: [id number] Date of Birth: 10/23/1970 Referring Provider: Dr. Drue Novel  Encounter Date: 10/06/2016      PT End of Session - 10/06/16 1607    Visit Number 1   Number of Visits 23   Date for PT Re-Evaluation 12/01/16   Authorization Type 23 visit limit   PT Start Time 1525   PT Stop Time 1615   PT Time Calculation (min) 50 min   Activity Tolerance Patient tolerated treatment well      Past Medical History:  Diagnosis Date  . Depression   . Frequent episodic tension-type headache   . Sinus polyp     Past Surgical History:  Procedure Laterality Date  . BUNIONECTOMY Right 2013  . NASAL SINUS SURGERY     x3  . OVARIAN CYST SURGERY    . TONSILLECTOMY AND ADENOIDECTOMY      There were no vitals filed for this visit.       Subjective Assessment - 10/06/16 1528    Subjective Left shoulder pain for months with certain positions;  I can't do certain machines at the gym.  Can't do side plank or push up.  No specific injury.  Hard to put on a sports bra, coat.  Folding sheets is painful.  Pain with shoulder extension.     Pertinent History underworking thyroid per lab work   Limitations House hold activities   Diagnostic tests not yet   Patient Stated Goals work out at [employer] and do normal exercises   Currently in Pain? Yes   Pain Score 0-No pain   Pain Location Shoulder   Pain Orientation Left   Pain Type Chronic pain;Acute pain   Pain Onset More than a month ago   Pain Frequency Intermittent   Aggravating Factors  extend arm, put on coat; put on sports bra   Pain Relieving Factors heat; Biofreeze, massages            [facility] PT Assessment - 10/06/16 0001      Assessment   Medical Diagnosis acute left shoulder    Referring Provider Dr.  Drue Novel   Onset Date/Surgical Date --  3 months   Hand Dominance Right   Next MD Visit not scheduled   Prior Therapy no     Precautions   Precautions None     Restrictions   Weight Bearing Restrictions No     Balance Screen   Has the patient fallen in the past 6 months No   Has the patient had a decrease in activity level because of a fear of falling?  No   Is the patient reluctant to leave their home because of a fear of falling?  No     Home Tourist information centre manager residence   Living Arrangements Spouse/significant other   Available Help at Discharge Family   Type of Home House     Prior Function   Level of Independence Independent with basic ADLs   Vocation Full time employment   Vocation Requirements HR desk work, driving   Leisure ex at gym; go to [employer], watch TV, cook     Observation/Other Assessments   Focus on Therapeutic Outcomes (FOTO)  31% limitation     Posture/Postural Control   Posture/Postural Control No significant limitations  AROM   Right Shoulder Flexion 175 Degrees   Right Shoulder ABduction 175 Degrees   Right Shoulder Internal Rotation --  T1   Right Shoulder External Rotation 80 Degrees   Left Shoulder Flexion 160 Degrees   Left Shoulder ABduction 168 Degrees   Left Shoulder Internal Rotation --  T1   Left Shoulder External Rotation 53 Degrees  T10 painful     Strength   Left Shoulder Flexion 4+/5   Left Shoulder Extension 4+/5   Left Shoulder ABduction 4+/5   Left Shoulder Internal Rotation 4-/5   Left Shoulder External Rotation 4/5   Left Shoulder Horizontal ABduction 4/5     Palpation   Palpation comment mild increased left scapular elevation   minimal tenderness in upper trap and periscapular muscles     Neer Impingement test    Findings Negative     Hawkins-Kennedy test   Findings Negative     Lift-Off test   Findings Positive   Side Left     Empty Can test   Findings Negative     Full Can test    Findings Negative     Lag time at 0 degrees   Findings Negative     Lag signs at 90 degrees    Findings Positive   Side Left     Drop Arm test   Findings Negative     Painful Arc of Motion   Findings Negative                           PT Education - 10/06/16 1710    Education provided Yes   Education Details wall and supine flexion and abduction stretch; prayer stretch; use of ice   Person(s) Educated Patient   Methods Explanation;Demonstration  pt declined written handout   Comprehension Verbalized understanding;Returned demonstration          PT Short Term Goals - 10/06/16 1804      PT SHORT TERM GOAL #1   Title The patient will demonstrate compliance with HEP, self care strategies to decrease inflammation and promote healing   11/03/16   Time 4   Period Weeks     PT SHORT TERM GOAL #2   Title The patient will have 170 degrees of left shoulder flexion and abduction    Time 4   Period Weeks   Status New     PT SHORT TERM GOAL #3   Title The patient will report a 30% reduction in shoulder pain with putting on a coat, sports bra or folding sheets at home   Time 4   Period Weeks   Status New           PT Long Term Goals - 10/06/16 1807      PT LONG TERM GOAL #1   Title The patient will be independent in safe self progression of HEP for further improvements in strength  12/01/16   Time 8   Period Weeks   Status New     PT LONG TERM GOAL #2   Title The patient will be able to return to 80% of previous gym program    Time 8   Period Weeks   Status New     PT LONG TERM GOAL #3   Title The patient will report a 60% improvement in left shoulder pain with putting on her coat, sports bra or folding sheets   Time 8   Period Weeks  Status New     PT LONG TERM GOAL #4   Title Left shoulder and scapular strength grossly 4+/5 to 5-/5 needed for higher level ex's (UE weightbearing planks and push ups)   Time 8   Period Weeks   Status  New     PT LONG TERM GOAL #5   Title FOTO functional outcome score improved from 31% to 24% indicating improved function with less pain   Time 8   Period Weeks   Status New               Plan - 10/06/16 1757    Clinical Impression Statement Left shoulder pain for months with certain positions (internal rotation and extension).   I can't do certain machines at the gym.  Can't do side plank or push up.  No specific injury.  Hard to put on a sports bra, coat.  Folding sheets is painful.  The patient has slightly limited shoulder flexion, external rotation and abduction ROM and painful internal and external rotation.  Glenohumeral and scapular strength grossly 4+/5 except internal rotation 4-/5.  Positive 90 degree lag sign and lift off test.  The patient is of low complexity evaluation secondary to good home support, stable status and lack of co-morbidities   Rehab Potential Good   PT Frequency 2x / week   PT Duration 8 weeks   PT Treatment/Interventions ADLs/Self Care Home Management;Electrical Stimulation;Cryotherapy;Iontophoresis /ml Dexamethasone;Moist Heat;Ultrasound;Patient/family education;Neuromuscular re-education;Therapeutic exercise;Therapeutic activities;Manual techniques;Taping;Dry needling   PT Next Visit Plan ionto if cert signed; scapular strengthening;  rotator cuff strengthening;  kinesiotaping      Patient will benefit from skilled therapeutic intervention in order to improve the following deficits and impairments:  Decreased range of motion, Decreased strength, Impaired UE functional use, Pain  Visit Diagnosis: Acute pain of left shoulder - Plan: PT plan of care cert/re-cert  Stiffness of left shoulder, not elsewhere classified - Plan: PT plan of care cert/re-cert  Muscle weakness (generalized) - Plan: PT plan of care cert/re-cert     Problem List Patient Active Problem List   Diagnosis Date Noted  . PCP NOTES>>>>>>>>>>>>>>> 08/01/2016  . Influenza A  08/18/2015  . Clinical depression 10/18/2013  . UTI (urinary tract infection) 12/10/2012  . Sinusitis 01/26/2011  . Constipation 12/28/2010  . General medical examination 12/28/2010  . Immunity status testing 12/28/2010   Lavinia Sharps, PT 10/06/16 6:15 PM Phone: [phone] Fax: [phone]  Vivien Presto 10/06/2016, 6:14 PM  [facility] [address] [place], [place], [address] Phone: [phone]   Fax:  [phone]  Name: TAHIRIH LAIR MRN: [id number] Date of Birth: 05-19-1971

## 2016-10-10 ENCOUNTER — Ambulatory Visit: Payer: 59 | Admitting: Physical Therapy

## 2016-10-10 ENCOUNTER — Encounter: Payer: Self-pay | Admitting: Physical Therapy

## 2016-10-10 DIAGNOSIS — M6281 Muscle weakness (generalized): Secondary | ICD-10-CM

## 2016-10-10 DIAGNOSIS — M25512 Pain in left shoulder: Secondary | ICD-10-CM | POA: Diagnosis not present

## 2016-10-10 DIAGNOSIS — M25612 Stiffness of left shoulder, not elsewhere classified: Secondary | ICD-10-CM

## 2016-10-10 NOTE — Patient Instructions (Addendum)
IONTOPHORESIS PATIENT PRECAUTIONS & CONTRAINDICATIONS:  . Redness under one or both electrodes can occur.  This characterized by a uniform redness that usually disappears within 12 hours of treatment. . Small pinhead size blisters may result in response to the drug.  Contact your physician if the problem persists more than 24 hours. . On rare occasions, iontophoresis therapy can result in temporary skin reactions such as rash, inflammation, irritation or burns.  The skin reactions may be the result of individual sensitivity to the ionic solution used, the condition of the skin at the start of treatment, reaction to the materials in the electrodes, allergies or sensitivity to dexamethasone, or a poor connection between the patch and your skin.  Discontinue using iontophoresis if you have any of these reactions and report to your therapist. . Remove the Patch or electrodes if you have any undue sensation of pain or burning during the treatment and report discomfort to your therapist. . Tell your Therapist if you have had known adverse reactions to the application of electrical current. . If using the Patch, the LED light will turn off when treatment is complete and the patch can be removed.  Approximate treatment time is 1-3 hours.  Remove the patch when light goes off or after 6 hours. . The Patch can be worn during normal activity, however excessive motion where the electrodes have been placed can cause poor contact between the skin and the electrode or uneven electrical current resulting in greater risk of skin irritation. Marland Kitchen Keep out of the reach of children.   . DO NOT use if you have a cardiac pacemaker or any other electrically sensitive implanted device. . DO NOT use if you have a known sensitivity to dexamethasone. . DO NOT use during Magnetic Resonance Imaging (MRI). . DO NOT use over broken or compromised skin (e.g. sunburn, cuts, or acne) due to the increased risk of skin reaction. . DO  NOT SHAVE over the area to be treated:  To establish good contact between the Patch and the skin, excessive hair may be clipped. . DO NOT place the Patch or electrodes on or over your eyes, directly over your heart, or brain. . DO NOT reuse the Patch or electrodes as this may cause burns to occur. Triceps, Overhead I    With other hand cupping opposite elbow, bring arm over head and bend elbow as far as possible. Use other hand to gently stretch further. Hold _15__ seconds. Repeat _2__ times per session. Do _2__ sessions per day.  Copyright  VHI. All rights reserved.  Extension    Stand with hands clasped behind back. Extend arms out as far as possible. Hold _15___ seconds. Repeat _2___ times. Do __2__ sessions per day.  Copyright  VHI. All rights reserved.  Posterior Capsule Stretch    Stand or sit, one arm across body so hand rests over opposite shoulder. Gently push on crossed elbow with other hand until stretch is felt in shoulder of crossed arm. Hold _15__ seconds.  Repeat __2_ times per session. Do _2__ sessions per day.  Copyright  VHI. All rights reserved.  [facility] [address], [address] [place], [place] [address] Phone # [phone] Fax [phone]

## 2016-10-10 NOTE — Therapy (Signed)
[facility] [address], [place], [place], [address] Phone: [phone]   Fax:  [phone]  Physical Therapy Treatment  Patient Details  Name: Miranda Hoover MRN: [id number] Date of Birth: Jul 13, 1970 Referring Provider: Dr. Larose Kells  Encounter Date: 10/10/2016      PT End of Session - 10/10/16 1450    Visit Number 2   Number of Visits 23   Date for PT Re-Evaluation 12/01/16   Authorization Type 23 visit limit   Authorization - Visit Number 2   Authorization - Number of Visits 23   PT Start Time 1450   PT Stop Time 1530   PT Time Calculation (min) 40 min   Activity Tolerance Patient tolerated treatment well   Behavior During Therapy [facility] for tasks assessed/performed      Past Medical History:  Diagnosis Date  . Depression   . Frequent episodic tension-type headache   . Sinus polyp     Past Surgical History:  Procedure Laterality Date  . BUNIONECTOMY Right 2013  . NASAL SINUS SURGERY     x3  . OVARIAN CYST SURGERY    . TONSILLECTOMY AND ADENOIDECTOMY      There were no vitals filed for this visit.      Subjective Assessment - 10/10/16 1451    Subjective I feel okay if I am not doing anything.    Diagnostic tests not yet   Patient Stated Goals work out at [employer] and do normal exercises   Currently in Pain? Yes   Pain Score 4    Pain Location Shoulder   Pain Orientation Left   Pain Type Chronic pain   Pain Onset More than a month ago   Pain Frequency Intermittent   Aggravating Factors  putting hand behind back. Putting bra on   Pain Relieving Factors heat; biofreeze, massage   Multiple Pain Sites No                         [facility] Adult PT Treatment/Exercise - 10/10/16 0001      Modalities   Modalities Iontophoresis     Iontophoresis   Type of Iontophoresis Dexamethasone   Location posterior left triceps   Dose 1 ml   Time 6 hour patch     Manual Therapy   Manual  Therapy Soft tissue mobilization;Joint mobilization;Passive ROM   Joint Mobilization posterior and distraction mobilization to left shoulder grade 3; P-A and rotational mobilization to T1-T6 in prone grade 3   Soft tissue mobilization left upper trap, levator scapula, subscapularis, interscapular area, left triceps, along the left A-C joint   Passive ROM left shoulder for flexion and abduction to endrange stretch                PT Education - 10/10/16 1540    Education provided Yes   Education Details stretches for right shoulder   Person(s) Educated Patient   Methods Explanation;Demonstration;Verbal cues;Handout   Comprehension Returned demonstration;Verbalized understanding          PT Short Term Goals - 10/10/16 1549      PT SHORT TERM GOAL #1   Title The patient will demonstrate compliance with HEP, self care strategies to decrease inflammation and promote healing   11/03/16   Time 4   Period Weeks   Status On-going     PT SHORT TERM GOAL #2   Title The patient will have 170 degrees of left shoulder flexion  and abduction    Time 4   Period Weeks   Status On-going     PT SHORT TERM GOAL #3   Title The patient will report a 30% reduction in shoulder pain with putting on a coat, sports bra or folding sheets at home   Time 4   Period Weeks   Status On-going           PT Long Term Goals - 10/06/16 1807      PT LONG TERM GOAL #1   Title The patient will be independent in safe self progression of HEP for further improvements in strength  12/01/16   Time 8   Period Weeks   Status New     PT LONG TERM GOAL #2   Title The patient will be able to return to 80% of previous gym program    Time 8   Period Weeks   Status New     PT LONG TERM GOAL #3   Title The patient will report a 60% improvement in left shoulder pain with putting on her coat, sports bra or folding sheets   Time 8   Period Weeks   Status New     PT LONG TERM GOAL #4   Title Left shoulder  and scapular strength grossly 4+/5 to 5-/5 needed for higher level ex's (UE weightbearing planks and push ups)   Time 8   Period Weeks   Status New     PT LONG TERM GOAL #5   Title FOTO functional outcome score improved from 31% to 24% indicating improved function with less pain   Time 8   Period Weeks   Status New               Plan - 10/10/16 1541    Clinical Impression Statement Patient has not met goals due to just starting therapy. Patient has tightness in left triceps, left subscapularis, and left upper trap. After therapy pain decreased.  Patient had tightness in the mid thoracic vertebrae. Patient will benefit from skilled therapy to reduce pain and improve strength.    Rehab Potential Good   Clinical Impairments Affecting Rehab Potential None    PT Frequency 2x / week   PT Duration 8 weeks   PT Treatment/Interventions ADLs/Self Care Home Management;Electrical Stimulation;Cryotherapy;Iontophoresis 4mg/ml Dexamethasone;Moist Heat;Ultrasound;Patient/family education;Neuromuscular re-education;Therapeutic exercise;Therapeutic activities;Manual techniques;Taping;Dry needling   PT Next Visit Plan see if ionto worked, scapula strengthening, RTC strength, soft tissue work to left subscapularis   PT Home Exercise Plan progress as needed   Recommended Other Services None   Consulted and Agree with Plan of Care Patient      Patient will benefit from skilled therapeutic intervention in order to improve the following deficits and impairments:  Decreased range of motion, Decreased strength, Impaired UE functional use, Pain  Visit Diagnosis: Acute pain of left shoulder  Stiffness of left shoulder, not elsewhere classified  Muscle weakness (generalized)     Problem List Patient Active Problem List   Diagnosis Date Noted  . PCP NOTES>>>>>>>>>>>>>>> 08/01/2016  . Influenza A 08/18/2015  . Clinical depression 10/18/2013  . UTI (urinary tract infection) 12/10/2012  .  Sinusitis 01/26/2011  . Constipation 12/28/2010  . General medical examination 12/28/2010  . Immunity status testing 12/28/2010    Cheryl Gray, PT 10/10/16 4:36 PM   [facility] [address], [address] [place], [place], [address] Phone: [phone]   Fax:  [phone]  Name: Brynne D Vicente MRN:   [id number] Date of Birth: May 13, 1971

## 2016-10-12 ENCOUNTER — Ambulatory Visit: Payer: 59 | Admitting: Physical Therapy

## 2016-10-12 ENCOUNTER — Encounter: Payer: Self-pay | Admitting: Physical Therapy

## 2016-10-12 DIAGNOSIS — M25512 Pain in left shoulder: Secondary | ICD-10-CM

## 2016-10-12 DIAGNOSIS — M25612 Stiffness of left shoulder, not elsewhere classified: Secondary | ICD-10-CM

## 2016-10-12 DIAGNOSIS — M6281 Muscle weakness (generalized): Secondary | ICD-10-CM

## 2016-10-12 NOTE — Patient Instructions (Addendum)
Scapular Retraction: Abduction / Extension (Prone)    Lie with arms out from sides 90. Pinch shoulder blades together and raise arms a few inches from floor. Repeat ___10_ times per set. Do __1__ sets per session. Do 1____ sessions per day.  http://orth.exer.us/958   Copyright  VHI. All rights reserved.  Scapular: Flexion (Prone)    Holding _0___ pound weights, raise both arms forward. Keep elbows straight. Repeat _10___ times per set. Do __1__ sets per session. Do __1__ sessions per day. y movement http://orth.exer.us/860   Copyright  VHI. All rights reserved.  [facility] [address] [place], [place] [address] Phone # [phone] Fax [phone]

## 2016-10-12 NOTE — Therapy (Signed)
[facility] [address], [address] [place], [place], [address] Phone: [phone]   Fax:  [phone]  Physical Therapy Treatment  Patient Details  Name: Miranda Hoover MRN: [id number] Date of Birth: 11/18/1970 Referring Provider: Dr. Drue Novel  Encounter Date: 10/12/2016      PT End of Session - 10/12/16 1456    Visit Number 3   Number of Visits 23   Date for PT Re-Evaluation 12/01/16   Authorization Type 23 visit limit   Authorization - Visit Number 3   Authorization - Number of Visits 23   PT Start Time 1400   PT Stop Time 1450   PT Time Calculation (min) 50 min   Activity Tolerance Patient tolerated treatment well   Behavior During Therapy [facility] for tasks assessed/performed      Past Medical History:  Diagnosis Date  . Depression   . Frequent episodic tension-type headache   . Sinus polyp     Past Surgical History:  Procedure Laterality Date  . BUNIONECTOMY Right 2013  . NASAL SINUS SURGERY     x3  . OVARIAN CYST SURGERY    . TONSILLECTOMY AND ADENOIDECTOMY      There were no vitals filed for this visit.      Subjective Assessment - 10/12/16 1404    Subjective I feel like the ionto patch has helped. I felt a tad bit better after therapy.    Pertinent History underworking thyroid per lab work   Limitations House hold activities   Diagnostic tests not yet   Patient Stated Goals work out at [employer] and do normal exercises   Currently in Pain? Yes   Pain Score 2    Pain Location Shoulder   Pain Orientation Left   Pain Descriptors / Indicators Dull;Aching   Pain Type Chronic pain   Pain Onset More than a month ago   Pain Frequency Intermittent   Aggravating Factors  putting hand behind back, putting bra on   Pain Relieving Factors heat, biofreeze, massage   Multiple Pain Sites No                         [facility] Adult PT Treatment/Exercise - 10/12/16 0001      Exercises   Exercises  Shoulder     Shoulder Exercises: Supine   Flexion Right;Left;Strengthening;10 reps  while on the foam roll   Flexion Limitations bil. shoulder flexion on foam roll 10x   Other Supine Exercises decompress on foam roll 1 min with diaphgramatic breathing then scapula retraction and protraction     Shoulder Exercises: Seated   Other Seated Exercises Depression of scapula with hands on blocks to push on     Shoulder Exercises: Standing   Other Standing Exercises facing wall with bil. shoulders flexed on wall and depress scapula     Shoulder Exercises: ROM/Strengthening   UBE (Upper Arm Bike) level 0 2 min forward/2 min backward with dull pain in left elbow     Iontophoresis   Type of Iontophoresis Dexamethasone   Location medial side of left scapula angle   Dose 1 ml   Time 6 hour patch     Manual Therapy   Manual Therapy Soft tissue mobilization;Joint mobilization;Passive ROM   Joint Mobilization posterior and distraction mobilization to left shoulder grade 3; P-A and rotational mobilization to T1-T6 in prone grade 3   Soft tissue mobilization left upper trap, levator scapula, subscapularis, interscapular area, left  triceps, along the left A-C joint                PT Education - 10/12/16 1453    Education provided Yes   Education Details scapula strengthening   Person(s) Educated Patient   Methods Explanation;Handout;Demonstration   Comprehension Returned demonstration;Verbalized understanding          PT Short Term Goals - 10/10/16 1549      PT SHORT TERM GOAL #1   Title The patient will demonstrate compliance with HEP, self care strategies to decrease inflammation and promote healing   11/03/16   Time 4   Period Weeks   Status On-going     PT SHORT TERM GOAL #2   Title The patient will have 170 degrees of left shoulder flexion and abduction    Time 4   Period Weeks   Status On-going     PT SHORT TERM GOAL #3   Title The patient will report a 30% reduction  in shoulder pain with putting on a coat, sports bra or folding sheets at home   Time 4   Period Weeks   Status On-going           PT Long Term Goals - 10/06/16 1807      PT LONG TERM GOAL #1   Title The patient will be independent in safe self progression of HEP for further improvements in strength  12/01/16   Time 8   Period Weeks   Status New     PT LONG TERM GOAL #2   Title The patient will be able to return to 80% of previous gym program    Time 8   Period Weeks   Status New     PT LONG TERM GOAL #3   Title The patient will report a 60% improvement in left shoulder pain with putting on her coat, sports bra or folding sheets   Time 8   Period Weeks   Status New     PT LONG TERM GOAL #4   Title Left shoulder and scapular strength grossly 4+/5 to 5-/5 needed for higher level ex's (UE weightbearing planks and push ups)   Time 8   Period Weeks   Status New     PT LONG TERM GOAL #5   Title FOTO functional outcome score improved from 31% to 24% indicating improved function with less pain   Time 8   Period Weeks   Status New               Plan - 10/12/16 1457    Clinical Impression Statement Patient had less trigger points in the left RTC muscle, subscapularis, and triceps.  Patient has improved mobility of T1-T6.  Patient needs strengthening of left scapula depressors and RTC muscles.  Patient will benefit from skilled therapy to reduce pain and improve strength.    Rehab Potential Good   Clinical Impairments Affecting Rehab Potential None    PT Frequency 2x / week   PT Duration 8 weeks   PT Treatment/Interventions ADLs/Self Care Home Management;Electrical Stimulation;Cryotherapy;Iontophoresis /ml Dexamethasone;Moist Heat;Ultrasound;Patient/family education;Neuromuscular re-education;Therapeutic exercise;Therapeutic activities;Manual techniques;Taping;Dry needling   PT Next Visit Plan  if ionto , scapula strengthening, RTC strength, soft tissue work to left  subscapularis; scapula depressors   PT Home Exercise Plan progress as needed   Consulted and Agree with Plan of Care Patient      Patient will benefit from skilled therapeutic intervention in order to improve the following deficits and impairments:  Decreased range of motion, Decreased strength, Impaired UE functional use, Pain  Visit Diagnosis: Acute pain of left shoulder  Stiffness of left shoulder, not elsewhere classified  Muscle weakness (generalized)     Problem List Patient Active Problem List   Diagnosis Date Noted  . PCP NOTES>>>>>>>>>>>>>>> 08/01/2016  . Influenza A 08/18/2015  . Clinical depression 10/18/2013  . UTI (urinary tract infection) 12/10/2012  . Sinusitis 01/26/2011  . Constipation 12/28/2010  . General medical examination 12/28/2010  . Immunity status testing 12/28/2010    Eulis Foster, PT 10/12/16 3:00 PM   [facility] [address] [place], [place], [address] Phone: [phone]   Fax:  [phone]  Name: LAURELYN TERRERO MRN: [id number] Date of Birth: [birth year]

## 2016-10-20 ENCOUNTER — Ambulatory Visit: Payer: 59 | Attending: Family | Admitting: Physical Therapy

## 2016-10-20 ENCOUNTER — Encounter: Payer: Self-pay | Admitting: Physical Therapy

## 2016-10-20 DIAGNOSIS — M6281 Muscle weakness (generalized): Secondary | ICD-10-CM | POA: Diagnosis present

## 2016-10-20 DIAGNOSIS — M25612 Stiffness of left shoulder, not elsewhere classified: Secondary | ICD-10-CM | POA: Diagnosis present

## 2016-10-20 DIAGNOSIS — M25512 Pain in left shoulder: Secondary | ICD-10-CM | POA: Insufficient documentation

## 2016-10-20 NOTE — Therapy (Signed)
[facility] [address], [address] [place], [place], [address] Phone: [phone]   Fax:  [phone]  Physical Therapy Treatment  Patient Details  Name: Miranda GableDeborah D Effinger MRN: [id number] Date of Birth: 11-12-1970 Referring Provider: Dr. Drue NovelPaz  Encounter Date: 10/20/2016      PT End of Session - 10/20/16 1530    Visit Number 4   Number of Visits 23   Date for PT Re-Evaluation 12/01/16   Authorization Type 23 visit limit   Authorization - Visit Number 4   Authorization - Number of Visits 23   PT Start Time 1445   PT Stop Time 1530   PT Time Calculation (min) 45 min   Activity Tolerance Patient tolerated treatment well   Behavior [facility] for tasks assessed/performed      Past Medical History:  Diagnosis Date  . Depression   . Frequent episodic tension-type headache   . Sinus polyp     Past Surgical History:  Procedure Laterality Date  . BUNIONECTOMY Right 2013  . NASAL SINUS SURGERY     x3  . OVARIAN CYST SURGERY    . TONSILLECTOMY AND ADENOIDECTOMY      There were no vitals filed for this visit.      Subjective Assessment - 10/20/16 1450    Subjective I got a foam roller at home now and am exercise.    Pertinent History underworking thyroid per lab work   Limitations House hold activities   Diagnostic tests not yet   Patient Stated Goals work out at [employer] gym and do normal exercises   Currently in Pain? Yes   Pain Score 3    Pain Location Shoulder   Pain Orientation Left   Pain Descriptors / Indicators Aching;Dull   Pain Type Chronic pain   Pain Onset More than a month ago   Pain Frequency Intermittent   Aggravating Factors  putting bra on, reaching back to curl hair   Pain Relieving Factors heat, biofreeze, massage                         [facility] Adult PT Treatment/Exercise - 10/20/16 0001      Manual Therapy   Manual Therapy Soft tissue mobilization   Soft tissue  mobilization left upper trap, left subscapularis, left rhomboids, along left scapula border          Trigger Point Dry Needling - 10/20/16 1528    Consent Given? Yes   Education Handout Provided Yes   Muscles Treated Upper Body Rhomboids  done by Lavinia SharpsStacy Simpson PT   Rhomboids Response Twitch response elicited;Palpable increased muscle length  done by Lavinia SharpsStacy Simpson PT              PT Education - 10/20/16 1524    Education provided Yes   Education Details dry needling, foam roll UE exercises, foam roll trigger point massage   Person(s) Educated Patient   Methods Explanation;Demonstration;Verbal cues;Handout   Comprehension Verbalized understanding;Returned demonstration          PT Short Term Goals - 10/10/16 1549      PT SHORT TERM GOAL #1   Title The patient will demonstrate compliance with HEP, self care strategies to decrease inflammation and promote healing   11/03/16   Time 4   Period Weeks   Status On-going     PT SHORT TERM GOAL #2   Title The patient will have 170 degrees of left  shoulder flexion and abduction    Time 4   Period Weeks   Status On-going     PT SHORT TERM GOAL #3   Title The patient will report a 30% reduction in shoulder pain with putting on a coat, sports bra or folding sheets at home   Time 4   Period Weeks   Status On-going           PT Long Term Goals - 10/06/16 1807      PT LONG TERM GOAL #1   Title The patient will be independent in safe self progression of HEP for further improvements in strength  12/01/16   Time 8   Period Weeks   Status New     PT LONG TERM GOAL #2   Title The patient will be able to return to 80% of previous gym program    Time 8   Period Weeks   Status New     PT LONG TERM GOAL #3   Title The patient will report a 60% improvement in left shoulder pain with putting on her coat, sports bra or folding sheets   Time 8   Period Weeks   Status New     PT LONG TERM GOAL #4   Title Left shoulder and  scapular strength grossly 4+/5 to 5-/5 needed for higher level ex's (UE weightbearing planks and push ups)   Time 8   Period Weeks   Status New     PT LONG TERM GOAL #5   Title FOTO functional outcome score improved from 31% to 24% indicating improved function with less pain   Time 8   Period Weeks   Status New               Plan - 10/20/16 1530    Clinical Impression Statement Patient had trigger points in the left rhomboids that were released with dry needling.  Patient has difficulty with reaching behind her back to do her bra and curl her hair. Patient is feeling better.  Patient has purchased a foam roll to exercise.  Patient will benefit from skilled therapy to reduce pain and improve strength.    Rehab Potential Good   Clinical Impairments Affecting Rehab Potential None    PT Frequency 2x / week   PT Duration 8 weeks   PT Treatment/Interventions ADLs/Self Care Home Management;Electrical Stimulation;Cryotherapy;Iontophoresis 4mg /ml Dexamethasone;Moist Heat;Ultrasound;Patient/family education;Neuromuscular re-education;Therapeutic exercise;Therapeutic activities;Manual techniques;Taping;Dry needling   PT Next Visit Plan  if ionto , scapula strengthening, RTC strength, soft tissue work to left subscapularis; scapula depressors, dry needling   PT Home Exercise Plan progress as needed   Consulted and Agree with Plan of Care Patient      Patient will benefit from skilled therapeutic intervention in order to improve the following deficits and impairments:  Decreased range of motion, Decreased strength, Impaired UE functional use, Pain  Visit Diagnosis: Acute pain of left shoulder  Stiffness of left shoulder, not elsewhere classified  Muscle weakness (generalized)     Problem List Patient Active Problem List   Diagnosis Date Noted  . PCP NOTES>>>>>>>>>>>>>>> 08/01/2016  . Influenza A 08/18/2015  . Clinical depression 10/18/2013  . UTI (urinary tract infection)  12/10/2012  . Sinusitis 01/26/2011  . Constipation 12/28/2010  . General medical examination 12/28/2010  . Immunity status testing 12/28/2010    Eulis Foster, PT 10/20/16 3:33 PM    [facility] [address] [place], [place], [address] Phone: [phone]  Fax:  [phone]  Name: ROGUE PAUTLER MRN: [id number] Date of Birth: [birth year]

## 2016-10-20 NOTE — Patient Instructions (Addendum)
Lay on foam roller and decompressArm Lengthener: Double    Arms at sides, palms down. Lift both arms over head to alongside ears, keeping elbows straight. Lengthen arms by pulling ribs away from pelvis. Hold _1__ seconds. Relax. Return arms to sides. Do on foam roll Copyright  VHI. All rights reserved.    Arm Lengthener: Single    Arms at sides, palms down. Lift one arm over head to alongside ear, keeping elbow straight. Lengthen arm by pulling ribs away from pelvis. Hold _1__ seconds. Relax. Repeat 1 time. Return arm to side. Repeat with other arm. Do on foam roller. Alternating arms Copyright  VHI. All rights reserved.  Side Pull: Double Arm    On back, knees bent, feet flat. Arms perpendicular to body, shoulder level, elbows straight but relaxed. Pull arms out to sides, elbows straight. Resistance band comes across collarbones, hands toward floor. Hold momentarily. Slowly return to starting position. Repeat _10__ times. Band color __yellow___    Copyright  VHI. All rights reserved.  Over Head Pull: Wide Grip    On back, knees bent, feet flat, band across thighs, elbows straight but relaxed. Pull hands apart (start). Keeping elbows straight, bring arms up and over head, hands toward floor. Keep steady pull on band. Hold momentarily. Return slowly, keeping pull steady, back to start. Repeat _10__ times. Band color _yellow_____  Y movement  Copyright  VHI. All rights reserved.    Begin exercise in supine position with mid-back placed directly on the foam roller with both elbows in front of face and both legs supporting body weight. Next, roll the mid-back across the foam roller from high to low. You may allow the upper back to bend or fold around the foam roller while maintaining your hip height to promote greater thoracic spine mobility (extension). Never perform exercise to the point of pain, but make sure that your muscle is feeling just short of the pain threshold as this  will help to establish the best mobility in the muscle tissue.    Go into a plank position over the foam roller.  Roll the front leg muscles from just below the abdominals down to the kneecaps on both sides.    VARIATION: can turn legs inwards or outwards and get different portions of the front leg muscles.  [facility] [address], [address] [place], [place] [address] Phone # [phone] Fax [phone]  Trigger Point Dry Needling  . What is Trigger Point Dry Needling (DN)? o DN is a physical therapy technique used to treat muscle pain and dysfunction. Specifically, DN helps deactivate muscle trigger points (muscle knots).  o A thin filiform needle is used to penetrate the skin and stimulate the underlying trigger point. The goal is for a local twitch response (LTR) to occur and for the trigger point to relax. No medication of any kind is injected during the procedure.   . What Does Trigger Point Dry Needling Feel Like?  o The procedure feels different for each individual patient. Some patients report that they do not actually feel the needle enter the skin and overall the process is not painful. Very mild bleeding may occur. However, many patients feel a deep cramping in the muscle in which the needle was inserted. This is the local twitch response.   Marland Kitchen. How Will I feel after the treatment? o Soreness is normal, and the onset of soreness may not occur for a few hours. Typically this soreness does not last longer than two days.  o Bruising is uncommon, however; ice can be used to decrease any possible bruising.  o In rare cases feeling tired or nauseous after the treatment is normal. In addition, your symptoms may get worse before they get better, this period will typically not last longer than 24 hours.   . What Can I do After My Treatment? o Increase your hydration by drinking more water for the next 24 hours. o You may place ice or heat on the areas treated that have  become sore, however, do not use heat on inflamed or bruised areas. Heat often brings more relief post needling. o You can continue your regular activities, but vigorous activity is not recommended initially after the treatment for 24 hours. o DN is best combined with other physical therapy such as strengthening, stretching, and other therapies.    [facility] [address] [place], [place] [address] Phone # [phone] Fax [phone]

## 2016-10-24 ENCOUNTER — Encounter: Payer: Self-pay | Admitting: Internal Medicine

## 2016-10-25 ENCOUNTER — Encounter: Payer: 59 | Admitting: Physical Therapy

## 2016-10-25 ENCOUNTER — Ambulatory Visit: Payer: 59

## 2016-10-25 MED ORDER — FLUOXETINE HCL 20 MG PO CAPS
20.0000 mg | ORAL_CAPSULE | Freq: Every day | ORAL | 3 refills | Status: DC
Start: 2016-10-25 — End: 2017-11-09

## 2016-10-26 ENCOUNTER — Ambulatory Visit: Payer: 59

## 2016-10-26 DIAGNOSIS — M25512 Pain in left shoulder: Secondary | ICD-10-CM

## 2016-10-26 DIAGNOSIS — M6281 Muscle weakness (generalized): Secondary | ICD-10-CM

## 2016-10-26 DIAGNOSIS — M25612 Stiffness of left shoulder, not elsewhere classified: Secondary | ICD-10-CM

## 2016-10-26 NOTE — Patient Instructions (Addendum)
   Shoulder Rotation: Double Arm   On back, knees bent, feet flat, elbows tucked at sides, bent 90, hands palms up. Pull hands apart and down toward floor, keeping elbows near sides. Hold momentarily. Slowly return to starting position. Repeat _10__ times. Band color __yellow    Extension (Resistive Band)    With band looped around hand and wrist, use elbow movements only. Using other arm as anchor, straighten elbow, pushing down. Hold _5___ seconds. Repeat _10___ times. Do __3__ sessions per day.  Copyright  VHI. All rights reserved.      [facility] [address], [address] [place], [place] [address] Phone # [phone] Fax [phone]____

## 2016-10-26 NOTE — Therapy (Signed)
[facility] [address], [address] [place], [place], [address] Phone: [phone]   Fax:  [phone]  Physical Therapy Treatment  Patient Details  Name: Miranda Hoover MRN: [id number] Date of Birth: 06-11-1971 Referring Provider: Dr. Drue Novel  Encounter Date: 10/26/2016      PT End of Session - 10/26/16 1055    Visit Number 5   Number of Visits 23   Date for PT Re-Evaluation 12/01/16   Authorization Type 23 visit limit   Authorization - Visit Number 5   Authorization - Number of Visits 23   PT Start Time 1011   PT Stop Time 1054   PT Time Calculation (min) 43 min   Activity Tolerance Patient tolerated treatment well   Behavior During Therapy [facility] for tasks assessed/performed      Past Medical History:  Diagnosis Date  . Depression   . Frequent episodic tension-type headache   . Sinus polyp     Past Surgical History:  Procedure Laterality Date  . BUNIONECTOMY Right 2013  . NASAL SINUS SURGERY     x3  . OVARIAN CYST SURGERY    . TONSILLECTOMY AND ADENOIDECTOMY      There were no vitals filed for this visit.      Subjective Assessment - 10/26/16 1007    Subjective The needling helped to loosen the muscles.  Pt reports 40% overall improvement.  Still limited with weight lifting and reaching behind.    Currently in Pain? Yes   Pain Location Shoulder   Pain Orientation Left   Pain Descriptors / Indicators Aching;Dull   Pain Type Chronic pain   Pain Onset More than a month ago   Pain Frequency Intermittent   Aggravating Factors  reaching back, putting on bra   Pain Relieving Factors heat, not performing aggravating motion, stretching            [facility] PT Assessment - 10/26/16 0001      AROM   Right Shoulder Flexion 175 Degrees   Right Shoulder ABduction 175 Degrees                     [facility] Adult PT Treatment/Exercise - 10/26/16 0001      Shoulder Exercises: Supine   External Rotation  Strengthening;Both;20 reps;Theraband   Theraband Level (Shoulder External Rotation) Level 4 (Blue)     Shoulder Exercises: Seated   Other Seated Exercises triceps extension with blue band 2x10     Manual Therapy   Manual Therapy Soft tissue mobilization;Joint mobilization   Manual therapy comments soft tissue elongation to Lt rhomboids, upper trap and lats with pt in prone.     Joint Mobilization P/ROM of Lt shoulder into IR, ER and abduction with posterior and inferior glide of the glenohumeral joint          Trigger Point Dry Needling - 10/26/16 1053    Consent Given? Yes   Muscles Treated Upper Body Rhomboids;Upper trapezius  Lt only.  also Lats on the Lt   Upper Trapezius Response Twitch reponse elicited;Palpable increased muscle length   Rhomboids Response Twitch response elicited;Palpable increased muscle length              PT Education - 10/26/16 1051    Education provided Yes   Education Details ER with blue band in supine, triceps extension   Person(s) Educated Patient   Methods Explanation;Demonstration;Handout   Comprehension Verbalized understanding          PT  Short Term Goals - 10/26/16 1012      PT SHORT TERM GOAL #1   Title The patient will demonstrate compliance with HEP, self care strategies to decrease inflammation and promote healing   11/03/16   Status Achieved     PT SHORT TERM GOAL #2   Title The patient will have 170 degrees of left shoulder flexion and abduction    Status Achieved     PT SHORT TERM GOAL #3   Title The patient will report a 30% reduction in shoulder pain with putting on a coat, sports bra or folding sheets at home   Status Achieved           PT Long Term Goals - 10/06/16 1807      PT LONG TERM GOAL #1   Title The patient will be independent in safe self progression of HEP for further improvements in strength  12/01/16   Time 8   Period Weeks   Status New     PT LONG TERM GOAL #2   Title The patient will be  able to return to 80% of previous gym program    Time 8   Period Weeks   Status New     PT LONG TERM GOAL #3   Title The patient will report a 60% improvement in left shoulder pain with putting on her coat, sports bra or folding sheets   Time 8   Period Weeks   Status New     PT LONG TERM GOAL #4   Title Left shoulder and scapular strength grossly 4+/5 to 5-/5 needed for higher level ex's (UE weightbearing planks and push ups)   Time 8   Period Weeks   Status New     PT LONG TERM GOAL #5   Title FOTO functional outcome score improved from 31% to 24% indicating improved function with less pain   Time 8   Period Weeks   Status New               Plan - 10/26/16 1056    Clinical Impression Statement Pt with 175 degrees Lt shoulder flexion and abduction.  Pt reports 40% overall improvement in symptoms since the start of care.  Pt limited with reaching behind the back and lifting weights that require reaching into extension.  Pt with trigger points in Lt rhomboids and lats and demonstrated improved tissue mobility and reduced pain after needling and dry needling today.  Pt will benefit from skilled PT for Lt shoulder strength, flexibility and dry needling and manual to reduce pain.     Rehab Potential Good   PT Frequency 2x / week   PT Duration 8 weeks   PT Treatment/Interventions ADLs/Self Care Home Management;Electrical Stimulation;Cryotherapy;Iontophoresis 4mg /ml Dexamethasone;Moist Heat;Ultrasound;Patient/family education;Neuromuscular re-education;Therapeutic exercise;Therapeutic activities;Manual techniques;Taping;Dry needling   PT Next Visit Plan assess response to dry needling #2 and needle again if helpfu.  manual, scapular strength.  Pt will return the week of 11/07/16.   Consulted and Agree with Plan of Care Patient      Patient will benefit from skilled therapeutic intervention in order to improve the following deficits and impairments:  Decreased range of motion,  Decreased strength, Impaired UE functional use, Pain  Visit Diagnosis: Acute pain of left shoulder  Stiffness of left shoulder, not elsewhere classified  Muscle weakness (generalized)     Problem List Patient Active Problem List   Diagnosis Date Noted  . PCP NOTES>>>>>>>>>>>>>>> 08/01/2016  . Influenza A 08/18/2015  .  Clinical depression 10/18/2013  . UTI (urinary tract infection) 12/10/2012  . Sinusitis 01/26/2011  . Constipation 12/28/2010  . General medical examination 12/28/2010  . Immunity status testing 12/28/2010     Lorrene ReidKelly Gianella Chismar, PT 10/26/16 11:00 AM  [facility] [address] [place], [place], [address] Phone: [phone]   Fax:  [phone]  Name: Norval GableDeborah D Yin MRN: [id number] Date of Birth: [birth year]

## 2016-11-07 ENCOUNTER — Other Ambulatory Visit (INDEPENDENT_AMBULATORY_CARE_PROVIDER_SITE_OTHER): Payer: 59

## 2016-11-07 ENCOUNTER — Ambulatory Visit: Payer: 59

## 2016-11-07 DIAGNOSIS — R7989 Other specified abnormal findings of blood chemistry: Secondary | ICD-10-CM

## 2016-11-07 DIAGNOSIS — M25512 Pain in left shoulder: Secondary | ICD-10-CM

## 2016-11-07 DIAGNOSIS — R946 Abnormal results of thyroid function studies: Secondary | ICD-10-CM

## 2016-11-07 DIAGNOSIS — M25612 Stiffness of left shoulder, not elsewhere classified: Secondary | ICD-10-CM

## 2016-11-07 DIAGNOSIS — M6281 Muscle weakness (generalized): Secondary | ICD-10-CM

## 2016-11-07 LAB — T4, FREE: Free T4: 0.9 ng/dL (ref 0.60–1.60)

## 2016-11-07 LAB — TSH: TSH: 1.7 u[IU]/mL (ref 0.35–4.50)

## 2016-11-07 LAB — T3, FREE: T3 FREE: 2.9 pg/mL (ref 2.3–4.2)

## 2016-11-07 NOTE — Therapy (Signed)
[facility] [address] [place], [place], [address] Phone: [phone]   Fax:  [phone]  Physical Therapy Treatment  Patient Details  Name: Miranda Hoover MRN: [id number] Date of Birth: Jul 26, 1970 Referring Provider: Dr. Drue Novel  Encounter Date: 11/07/2016      PT End of Session - 11/07/16 1608    Visit Number 6   Number of Visits 23   Date for PT Re-Evaluation 12/01/16   Authorization - Visit Number 6   Authorization - Number of Visits 23   PT Start Time 1533   PT Stop Time 1605  dry needling   PT Time Calculation (min) 32 min   Behavior During Therapy [facility] for tasks assessed/performed      Past Medical History:  Diagnosis Date  . Depression   . Frequent episodic tension-type headache   . Sinus polyp     Past Surgical History:  Procedure Laterality Date  . BUNIONECTOMY Right 2013  . NASAL SINUS SURGERY     x3  . OVARIAN CYST SURGERY    . TONSILLECTOMY AND ADENOIDECTOMY      There were no vitals filed for this visit.      Subjective Assessment - 11/07/16 1531    Subjective I felt even better after last dry needling session.  Pt reports 60-70% overall improvement since the start of care.     Currently in Pain? Yes   Pain Score 2    Pain Location Shoulder   Pain Orientation Left   Pain Descriptors / Indicators Dull   Pain Type Chronic pain   Pain Onset More than a month ago   Pain Frequency Intermittent   Aggravating Factors  reaching back, putting on bra   Pain Relieving Factors heat, not performing aggraving motion, strething                         [facility] Adult PT Treatment/Exercise - 11/07/16 0001      Manual Therapy   Manual Therapy Soft tissue mobilization;Joint mobilization   Manual therapy comments soft tissue elongation to Lt rhomboids, upper trap and lats with pt in prone.     Joint Mobilization P/ROM of Lt shoulder into IR, ER and abduction with posterior and  inferior glide of the glenohumeral joint          Trigger Point Dry Needling - 11/07/16 1537    Consent Given? Yes   Muscles Treated Upper Body Rhomboids;Upper trapezius  Lats on Lt only   Upper Trapezius Response Twitch reponse elicited;Palpable increased muscle length   Rhomboids Response Twitch response elicited;Palpable increased muscle length                PT Short Term Goals - 11/07/16 1535      PT SHORT TERM GOAL #1   Title The patient will demonstrate compliance with HEP, self care strategies to decrease inflammation and promote healing   11/03/16   Status Achieved     PT SHORT TERM GOAL #2   Title The patient will have 170 degrees of left shoulder flexion and abduction    Status Achieved     PT SHORT TERM GOAL #3   Title The patient will report a 30% reduction in shoulder pain with putting on a coat, sports bra or folding sheets at home   Status Achieved           PT Long Term Goals - 11/07/16 1535  PT LONG TERM GOAL #1   Title The patient will be independent in safe self progression of HEP for further improvements in strength  12/01/16   Time 8   Period Weeks   Status On-going     PT LONG TERM GOAL #2   Title The patient will be able to return to 80% of previous gym program    Baseline limiting triceps and back exercises   Period Weeks   Status On-going     PT LONG TERM GOAL #3   Title The patient will report a 60% improvement in left shoulder pain with putting on her coat, sports bra or folding sheets   Status Achieved               Plan - 11/07/16 1605    Clinical Impression Statement Pt reports 60% overall improvement since the start of care.  Pt has not been able to get to the gym due to time constraints.  She will try getting there this week and see if she can return to her regular exercise routine.  Pt with trigger points in Lt rhomboids and subscapularis.  Pt with improved mobility and reduced trigger points after manual and  dry needling today.  Pt will be placed on hold until 12/01/16 and return if needed.     Rehab Potential Good   PT Frequency 2x / week   PT Duration 8 weeks   PT Treatment/Interventions ADLs/Self Care Home Management;Electrical Stimulation;Cryotherapy;Iontophoresis 4mg /ml Dexamethasone;Moist Heat;Ultrasound;Patient/family education;Neuromuscular re-education;Therapeutic exercise;Therapeutic activities;Manual techniques;Taping;Dry needling   PT Next Visit Plan assess response to dry needling #3 and needle again if helpful.  manual, scapular strength.  Pt will be on hold and return to PT only if needed by 12/01/16   Consulted and Agree with Plan of Care Patient      Patient will benefit from skilled therapeutic intervention in order to improve the following deficits and impairments:  Decreased range of motion, Decreased strength, Impaired UE functional use, Pain  Visit Diagnosis: Acute pain of left shoulder  Stiffness of left shoulder, not elsewhere classified  Muscle weakness (generalized)     Problem List Patient Active Problem List   Diagnosis Date Noted  . PCP NOTES>>>>>>>>>>>>>>> 08/01/2016  . Influenza A 08/18/2015  . Clinical depression 10/18/2013  . UTI (urinary tract infection) 12/10/2012  . Sinusitis 01/26/2011  . Constipation 12/28/2010  . General medical examination 12/28/2010  . Immunity status testing 12/28/2010     Lorrene ReidKelly Gabriel Paulding, PT 11/07/16 4:09 PM  [facility] [address] [place], [place], [address] Phone: [phone]   Fax:  [phone]  Name: Miranda Hoover MRN: [id number] Date of Birth: 08-27-1970

## 2016-11-23 ENCOUNTER — Ambulatory Visit: Payer: 59 | Attending: Family | Admitting: Physical Therapy

## 2016-11-23 DIAGNOSIS — M6281 Muscle weakness (generalized): Secondary | ICD-10-CM | POA: Insufficient documentation

## 2016-11-23 DIAGNOSIS — M25512 Pain in left shoulder: Secondary | ICD-10-CM | POA: Insufficient documentation

## 2016-11-23 DIAGNOSIS — M25612 Stiffness of left shoulder, not elsewhere classified: Secondary | ICD-10-CM

## 2016-11-23 NOTE — Therapy (Signed)
[facility] [address] [place], [place], [address] Phone: [phone]   Fax:  [phone]  Physical Therapy Treatment  Patient Details  Name: Miranda Hoover MRN: [id number] Date of Birth: December 10, 1970 Referring Provider: Dr. Drue NovelPaz  Encounter Date: 11/23/2016      PT End of Session - 11/23/16 1408    Visit Number 7   Number of Visits 23   Date for PT Re-Evaluation 12/01/16   Authorization Type 23 visit limit   Authorization - Visit Number 7   Authorization - Number of Visits 23   PT Start Time 1402  heat 10 min, dry needling   PT Stop Time 1455   PT Time Calculation (min) 53 min   Activity Tolerance Patient tolerated treatment well   Behavior During Therapy [facility] for tasks assessed/performed      Past Medical History:  Diagnosis Date  . Depression   . Frequent episodic tension-type headache   . Sinus polyp     Past Surgical History:  Procedure Laterality Date  . BUNIONECTOMY Right 2013  . NASAL SINUS SURGERY     x3  . OVARIAN CYST SURGERY    . TONSILLECTOMY AND ADENOIDECTOMY      There were no vitals filed for this visit.      Subjective Assessment - 11/23/16 1444    Subjective I felt better but lately it seems like it has been getting worse again and it hurt when I tried carrying a box under my left arm. Denies pain currently   Pertinent History underworking thyroid per lab work   Limitations House hold activities   Diagnostic tests not yet   Patient Stated Goals work out at [employer] gym and do normal exercises   Currently in Pain? No/denies                         [facility] Treatment/Ex[facility] - 11/23/16 0001      Exercises   Exercises --  reviewed HEP verbally during manual     Shoulder Exercises: Seated   Other Seated Exercises upper trap stretch - educated and performed          Trigger Point Dry Needling - 11/23/16 1445    Consent Given? Yes   Muscles Treated  Upper Body Upper trapezius;Supraspinatus;Rhomboids   Upper Trapezius Response Twitch reponse elicited;Palpable increased muscle length   Rhomboids Response Twitch response elicited;Palpable increased muscle length   Supraspinatus Response Twitch response elicited;Palpable increased muscle length              PT Education - 11/23/16 1444    Education provided Yes   Education Details upper trap stretch   Person(s) Educated Patient   Methods Explanation;Demonstration;Tactile cues;Verbal cues;Handout   Comprehension Verbalized understanding;Returned demonstration          PT Short Term Goals - 11/07/16 1535      PT SHORT TERM GOAL #1   Title The patient will demonstrate compliance with HEP, self care strategies to decrease inflammation and promote healing   11/03/16   Status Achieved     PT SHORT TERM GOAL #2   Title The patient will have 170 degrees of left shoulder flexion and abduction    Status Achieved     PT SHORT TERM GOAL #3   Title The patient will report a 30% reduction in shoulder pain with putting on a coat, sports bra or folding sheets at home   Status  Achieved           PT Long Term Goals - 11/23/16 1453      PT LONG TERM GOAL #1   Title The patient will be independent in safe self progression of HEP for further improvements in strength  12/01/16   Time 8   Period Weeks   Status On-going     PT LONG TERM GOAL #2   Title The patient will be able to return to 80% of previous gym program    Time 8   Period Weeks   Status On-going               Plan - 11/23/16 1447    Clinical Impression Statement Pt reports improvement but had flare up.  Responded well to dry needling with increased muscle length.  Upper trap was tight and patient given stretch to add to HEP . Pt understands and was able to verbally demonstrate understanding of current HEP.  Pt will return to PT for further strenghening so she can return to regular exercises at the gym.   Rehab  Potential Good   PT Treatment/Interventions ADLs/Self Care Home Management;Electrical Stimulation;Cryotherapy;Iontophoresis 4mg /ml Dexamethasone;Moist Heat;Ultrasound;Patient/family education;Neuromuscular re-education;Therapeutic exercise;Therapeutic activities;Manual techniques;Taping;Dry needling   PT Next Visit Plan assess response to dry needling #4 and needle again if helpful.  manual, scapular strength.  Pt will be on hold and return to PT only if needed by 12/01/16   Consulted and Agree with Plan of Care Patient      Patient will benefit from skilled therapeutic intervention in order to improve the following deficits and impairments:  Decreased range of motion, Decreased strength, Impaired UE functional use, Pain  Visit Diagnosis: Acute pain of left shoulder  Stiffness of left shoulder, not elsewhere classified  Muscle weakness (generalized)     Problem List Patient Active Problem List   Diagnosis Date Noted  . PCP NOTES>>>>>>>>>>>>>>> 08/01/2016  . Influenza A 08/18/2015  . Clinical depression 10/18/2013  . UTI (urinary tract infection) 12/10/2012  . Sinusitis 01/26/2011  . Constipation 12/28/2010  . General medical examination 12/28/2010  . Immunity status testing 12/28/2010    Vincente Poli, PT 11/23/2016, 3:01 PM  [facility] [address], [address] [place], [place], [address] Phone: [phone]   Fax:  [phone]  Name: Miranda Hoover MRN: [id number] Date of Birth: September 22, 1970

## 2016-11-23 NOTE — Patient Instructions (Signed)
Ear / Shoulder Stretch    Exhaling, move left ear toward left shoulder. Hold position for _20 sec. Inhaling, bring head back to center. Repeat to other side. Repeat sequence _3__ times. Do __1_ times per day.  Copyright  VHI. All rights reserved.

## 2016-12-07 ENCOUNTER — Ambulatory Visit: Payer: 59 | Admitting: Physical Therapy

## 2016-12-07 DIAGNOSIS — M25512 Pain in left shoulder: Secondary | ICD-10-CM

## 2016-12-07 DIAGNOSIS — M25612 Stiffness of left shoulder, not elsewhere classified: Secondary | ICD-10-CM

## 2016-12-07 DIAGNOSIS — M6281 Muscle weakness (generalized): Secondary | ICD-10-CM

## 2016-12-07 NOTE — Therapy (Addendum)
[facility] [address] [place], [place], [address] Phone: [phone]   Fax:  [phone]  Physical Therapy Treatment/Discharge Summary  Patient Details  Name: KRISTINA MCNORTON MRN: [id number] Date of Birth: [birth year] Referring Provider: Dr. Larose Kells  Encounter Date: 12/07/2016      PT End of Session - 12/07/16 1306    Visit Number 8   Number of Visits 23   Date for PT Re-Evaluation 01/18/17   Authorization Type 23 visit limit   Authorization - Visit Number 8   Authorization - Number of Visits 23   PT Start Time 1157  arrived late   PT Stop Time 1315   PT Time Calculation (min) 30 min   Activity Tolerance Patient tolerated treatment well   Behavior During Therapy [facility] for tasks assessed/performed      Past Medical History:  Diagnosis Date  . Depression   . Frequent episodic tension-type headache   . Sinus polyp     Past Surgical History:  Procedure Laterality Date  . BUNIONECTOMY Right 2013  . NASAL SINUS SURGERY     x3  . OVARIAN CYST SURGERY    . TONSILLECTOMY AND ADENOIDECTOMY      There were no vitals filed for this visit.          [facility] PT Assessment - 12/07/16 0001      AROM   Left Shoulder Internal Rotation --  limited 25% unable to get bra on     Strength   Left Shoulder Flexion 4+/5   Left Shoulder Extension 4+/5   Left Shoulder ABduction 4+/5   Left Shoulder Internal Rotation 4+/5   Left Shoulder External Rotation 4+/5   Left Shoulder Horizontal ABduction 4/5     Neer Impingement test    Findings Negative     Hawkins-Kennedy test   Findings Negative     Empty Can test   Findings Positive     Full Can test   Findings Negative                     [facility] Adult PT Treatment/Exercise - 12/07/16 0001      Exercises   Exercises Other Exercises   Other Exercises  educated and performed all exercises in HEP from today's treatment                PT  Education - 12/07/16 1329    Education provided Yes   Education Details HEP   Person(s) Educated Patient   Methods Explanation;Demonstration;Handout   Comprehension Verbalized understanding;Returned demonstration          PT Short Term Goals - 11/07/16 1535      PT SHORT TERM GOAL #1   Title The patient will demonstrate compliance with HEP, self care strategies to decrease inflammation and promote healing   11/03/16   Status Achieved     PT SHORT TERM GOAL #2   Title The patient will have 170 degrees of left shoulder flexion and abduction    Status Achieved     PT SHORT TERM GOAL #3   Title The patient will report a 30% reduction in shoulder pain with putting on a coat, sports bra or folding sheets at home   Status Achieved           PT Long Term Goals - 12/07/16 1255      PT LONG TERM GOAL #1   Title The patient will be independent in safe self  progression of HEP for further improvements in strength  01/18/17   Baseline given advanced HEP today 12/07/16   Time 6   Period Weeks   Status On-going     PT LONG TERM GOAL #2   Title The patient will be able to return to 80% of previous gym program    Baseline limited on tricep dip   Time 8   Period Weeks   Status On-going     PT LONG TERM GOAL #3   Title The patient will report a 60% improvement in left shoulder pain with putting on her coat, sports bra or folding sheets   Time 8   Period Weeks   Status Achieved     PT LONG TERM GOAL #4   Title Left shoulder and scapular strength grossly 4+/5 to 5-/5 needed for higher level ex's (UE weightbearing planks and push ups)   Time 6   Period Weeks   Status On-going     PT LONG TERM GOAL #5   Title FOTO functional outcome score improved from 31% to 24% indicating improved function with less pain   Baseline 37% limited 12/07/16   Time 6   Period Weeks   Status On-going     Additional Long Term Goals   Additional Long Term Goals Yes     PT LONG TERM GOAL #6   Title  able to don sports bra without assistance due to improved left shoulder ROM   Time 6   Period Weeks   Status New               Plan - 12/07/16 1336    Clinical Impression Statement Pt has made significant improvements.  However she continues to have RTC weakness that is limited her ability to perform certain tasks.  She is also limited with functional activities such as putting on her bra due to decreased ROM left shoulder.  Pt will return if having any difficulties with current exercise plan or if she continues to have limiting issues.     Rehab Potential Good   PT Treatment/Interventions ADLs/Self Care Home Management;Electrical Stimulation;Cryotherapy;Iontophoresis 81m/ml Dexamethasone;Moist Heat;Ultrasound;Patient/family education;Neuromuscular re-education;Therapeutic exercise;Therapeutic activities;Manual techniques;Taping;Dry needling   PT Next Visit Plan review HEP and advance as needed, joint mobs as needed   PT Home Exercise Plan progress as needed   Consulted and Agree with Plan of Care Patient      Patient will benefit from skilled therapeutic intervention in order to improve the following deficits and impairments:  Decreased range of motion, Decreased strength, Impaired UE functional use, Pain  Visit Diagnosis: Acute pain of left shoulder  Stiffness of left shoulder, not elsewhere classified  Muscle weakness (generalized)    PHYSICAL THERAPY DISCHARGE SUMMARY  Visits from Start of Care: 8 Current functional level related to goals / functional outcomes: See clinical impressions above.  The patient has not returned to PT and her chart has been inactive for > 2 months.     Remaining deficits: As above   Education / Equipment: Comprehensive HEP  Plan:                                                    Patient goals were partially met. Patient is being discharged due to not returning since the last visit.  ?????  Problem List Patient Active  Problem List   Diagnosis Date Noted  . PCP NOTES>>>>>>>>>>>>>>> 08/01/2016  . Influenza A 08/18/2015  . Clinical depression 10/18/2013  . UTI (urinary tract infection) 12/10/2012  . Sinusitis 01/26/2011  . Constipation 12/28/2010  . General medical examination 12/28/2010  . Immunity status testing 12/28/2010   Ruben Im, PT 03/03/17 11:09 AM Phone: [phone] Fax: [phone]  Zannie Cove, PT 12/07/2016, 1:40 PM  [facility] [address], [place], [place], [address] Phone: [phone]   Fax:  [phone]  Name: MASEY SCHEIBER MRN: [id number] Date of Birth: [birth year]

## 2016-12-07 NOTE — Patient Instructions (Signed)
   PRONE ROWS WITH EXTERNAL ROTATION - ER  Lying face down with your arm hanging down and elbows straight, draw your elbow up and back. Next, slowly twist your shoulder to raise your fist upwards yours your head as shown.           20 x each of above    Go up and down from hands to forearms keeping shoulder engages down and back

## 2017-03-20 LAB — OB RESULTS CONSOLE ABO/RH: RH TYPE: POSITIVE

## 2017-03-20 LAB — OB RESULTS CONSOLE RPR: RPR: NONREACTIVE

## 2017-03-20 LAB — OB RESULTS CONSOLE GC/CHLAMYDIA
CHLAMYDIA, DNA PROBE: NEGATIVE
GC PROBE AMP, GENITAL: NEGATIVE

## 2017-03-20 LAB — OB RESULTS CONSOLE HEPATITIS B SURFACE ANTIGEN: Hepatitis B Surface Ag: NEGATIVE

## 2017-03-20 LAB — OB RESULTS CONSOLE RUBELLA ANTIBODY, IGM: RUBELLA: IMMUNE

## 2017-03-20 LAB — OB RESULTS CONSOLE ANTIBODY SCREEN: ANTIBODY SCREEN: NEGATIVE

## 2017-03-20 LAB — OB RESULTS CONSOLE HIV ANTIBODY (ROUTINE TESTING): HIV: NONREACTIVE

## 2017-08-03 ENCOUNTER — Encounter: Payer: 59 | Admitting: Internal Medicine

## 2017-08-11 DIAGNOSIS — N979 Female infertility, unspecified: Secondary | ICD-10-CM | POA: Insufficient documentation

## 2017-09-15 LAB — OB RESULTS CONSOLE GBS: STREP GROUP B AG: NEGATIVE

## 2017-09-25 ENCOUNTER — Other Ambulatory Visit: Payer: Self-pay | Admitting: Obstetrics and Gynecology

## 2017-09-27 ENCOUNTER — Telehealth (HOSPITAL_COMMUNITY): Payer: Self-pay | Admitting: *Deleted

## 2017-09-27 ENCOUNTER — Encounter (HOSPITAL_COMMUNITY): Payer: Self-pay | Admitting: *Deleted

## 2017-09-27 NOTE — Telephone Encounter (Signed)
Preadmission screen  

## 2017-09-29 ENCOUNTER — Telehealth (HOSPITAL_COMMUNITY): Payer: Self-pay | Admitting: *Deleted

## 2017-09-29 NOTE — Telephone Encounter (Signed)
Preadmission screen  

## 2017-10-02 ENCOUNTER — Encounter (HOSPITAL_COMMUNITY): Payer: Self-pay

## 2017-10-05 NOTE — Patient Instructions (Signed)
Miranda GableDeborah D Hoover  10/05/2017   Your procedure is scheduled on:  10/09/2017  [facility] at 1015 AM.  Pick up the phone at the desk and dial [phone]  Call this number if you have problems the morning of surgery:[phone]  Remember:   Do not eat food:(After Midnight) Desps de medianoche.  Do not drink clear liquids: (After Midnight) Desps de medianoche.  Take these medicines the morning of surgery with A SIP OF WATER: none   Do not wear jewelry, make-up or nail polish.  Do not wear lotions, powders, or perfumes. Do not wear deodorant.  Do not shave 48 hours prior to surgery.  Do not bring valuables to the hospital.  [facility] is not   responsible for any belongings or valuables brought to the hospital.  Contacts, dentures or bridgework may not be worn into surgery.  Leave suitcase in the car. After surgery it may be brought to your room.  For patients admitted to the hospital, checkout time is 11:00 AM the day of              discharge.    N/A   Please read over the following fact sheets that you were given:   Surgical Site Infection Prevention

## 2017-10-06 ENCOUNTER — Encounter (HOSPITAL_COMMUNITY)
Admission: RE | Admit: 2017-10-06 | Discharge: 2017-10-06 | Disposition: A | Discharge status: Home / Self Care | Payer: 59 | Source: Ambulatory Visit | Attending: Obstetrics and Gynecology | Admitting: Obstetrics and Gynecology

## 2017-10-06 HISTORY — DX: Unspecified abnormal cytological findings in specimens from vagina: R87.629

## 2017-10-06 HISTORY — DX: Arcuate uterus: Q51.810

## 2017-10-06 LAB — CBC
HCT: 37.4 % (ref 36.0–46.0)
Hemoglobin: 12.8 g/dL (ref 12.0–15.0)
MCH: 29.4 pg (ref 26.0–34.0)
MCHC: 34.2 g/dL (ref 30.0–36.0)
MCV: 86 fL (ref 78.0–100.0)
Platelets: 188 10*3/uL (ref 150–400)
RBC: 4.35 MIL/uL (ref 3.87–5.11)
RDW: 14 % (ref 11.5–15.5)
WBC: 9 10*3/uL (ref 4.0–10.5)

## 2017-10-06 LAB — ABO/RH: ABO/RH(D): O POS

## 2017-10-06 LAB — TYPE AND SCREEN
ABO/RH(D): O POS
ANTIBODY SCREEN: NEGATIVE

## 2017-10-07 LAB — RPR: RPR Ser Ql: NONREACTIVE

## 2017-10-08 ENCOUNTER — Encounter (HOSPITAL_COMMUNITY): Payer: Self-pay | Admitting: Anesthesiology

## 2017-10-08 NOTE — Anesthesia Preprocedure Evaluation (Addendum)
Anesthesia Evaluation  Patient identified by MRN, date of birth, ID band Patient awake    Reviewed: Allergy & Precautions, NPO status , Patient's Chart, lab work & pertinent test results  Airway Mallampati: II  TM Distance: >3 FB Neck ROM: Full    Dental  (+) Teeth Intact, Caps Upper and lower permanent bridges:   Pulmonary    Pulmonary exam normal breath sounds clear to auscultation       Cardiovascular negative cardio ROS Normal cardiovascular exam Rhythm:Regular Rate:Normal     Neuro/Psych  Headaches, PSYCHIATRIC DISORDERS Depression    GI/Hepatic Neg liver ROS, GERD  Medicated and Controlled,  Endo/Other  Obesity  Renal/GU negative Renal ROS  negative genitourinary   Musculoskeletal   Abdominal Normal abdominal exam  (+) + obese,   Peds  Hematology   Anesthesia Other Findings   Reproductive/Obstetrics (+) Pregnancy Breech presentation IVF Uterus Arcuatus AMA                            Anesthesia Physical Anesthesia Plan  ASA: II  Anesthesia Plan: Spinal   Post-op Pain Management:    Induction:   PONV Risk Score and Plan:   Airway Management Planned: Natural Airway  Additional Equipment:   Intra-op Plan:   Post-operative Plan:   Informed Consent: I have reviewed the patients History and Physical, chart, labs and discussed the procedure including the risks, benefits and alternatives for the proposed anesthesia with the patient or authorized representative who has indicated his/her understanding and acceptance.     Plan Discussed with: CRNA, Surgeon and Anesthesiologist  Anesthesia Plan Comments:        Anesthesia Quick Evaluation

## 2017-10-09 ENCOUNTER — Other Ambulatory Visit: Payer: Self-pay

## 2017-10-09 ENCOUNTER — Inpatient Hospital Stay (HOSPITAL_COMMUNITY): Payer: 59 | Admitting: Anesthesiology

## 2017-10-09 ENCOUNTER — Encounter (HOSPITAL_COMMUNITY)
Admission: RE | Disposition: A | Discharge status: Home / Self Care | Payer: Self-pay | Source: Ambulatory Visit | Attending: Obstetrics and Gynecology

## 2017-10-09 ENCOUNTER — Encounter (HOSPITAL_COMMUNITY): Payer: Self-pay

## 2017-10-09 ENCOUNTER — Inpatient Hospital Stay (HOSPITAL_COMMUNITY)
Admission: RE | Admit: 2017-10-09 | Discharge: 2017-10-11 | DRG: 788 | Disposition: A | Discharge status: Home / Self Care | Payer: 59 | Source: Ambulatory Visit | Attending: Obstetrics and Gynecology | Admitting: Obstetrics and Gynecology

## 2017-10-09 DIAGNOSIS — K219 Gastro-esophageal reflux disease without esophagitis: Secondary | ICD-10-CM | POA: Diagnosis present

## 2017-10-09 DIAGNOSIS — Z3A39 39 weeks gestation of pregnancy: Secondary | ICD-10-CM

## 2017-10-09 DIAGNOSIS — Z3490 Encounter for supervision of normal pregnancy, unspecified, unspecified trimester: Secondary | ICD-10-CM

## 2017-10-09 DIAGNOSIS — O9962 Diseases of the digestive system complicating childbirth: Secondary | ICD-10-CM | POA: Diagnosis present

## 2017-10-09 DIAGNOSIS — O321XX0 Maternal care for breech presentation, not applicable or unspecified: Secondary | ICD-10-CM | POA: Diagnosis present

## 2017-10-09 DIAGNOSIS — O3403 Maternal care for unspecified congenital malformation of uterus, third trimester: Secondary | ICD-10-CM | POA: Diagnosis present

## 2017-10-09 DIAGNOSIS — E669 Obesity, unspecified: Secondary | ICD-10-CM | POA: Diagnosis present

## 2017-10-09 DIAGNOSIS — Q51810 Arcuate uterus: Secondary | ICD-10-CM | POA: Diagnosis not present

## 2017-10-09 DIAGNOSIS — O99214 Obesity complicating childbirth: Secondary | ICD-10-CM | POA: Diagnosis present

## 2017-10-09 SURGERY — Surgical Case
Anesthesia: Spinal

## 2017-10-09 MED ORDER — ZOLPIDEM TARTRATE 5 MG PO TABS: 5.0000 mg | ORAL_TABLET | Freq: Every evening | ORAL | Status: DC | PRN

## 2017-10-09 MED ORDER — LACTATED RINGERS IV SOLN: INTRAVENOUS | Status: DC

## 2017-10-09 MED ORDER — LACTATED RINGERS IV SOLN
INTRAVENOUS | Status: DC
  Administered 2017-10-09 (×3): via INTRAVENOUS

## 2017-10-09 MED ORDER — OXYCODONE-ACETAMINOPHEN 5-325 MG PO TABS
1.0000 | ORAL_TABLET | ORAL | Status: DC | PRN
  Administered 2017-10-10 (×3): 1 via ORAL
  Filled 2017-10-09 (×3): qty 1

## 2017-10-09 MED ORDER — SCOPOLAMINE 1 MG/3DAYS TD PT72
MEDICATED_PATCH | TRANSDERMAL | Status: AC
  Filled 2017-10-09: qty 1

## 2017-10-09 MED ORDER — BUPIVACAINE IN DEXTROSE 0.75-8.25 % IT SOLN
INTRATHECAL | Status: DC | PRN
  Administered 2017-10-09: 1.6 mL via INTRATHECAL

## 2017-10-09 MED ORDER — NALOXONE HCL 0.4 MG/ML IJ SOLN: 0.4000 mg | INTRAMUSCULAR | Status: DC | PRN

## 2017-10-09 MED ORDER — SODIUM CHLORIDE 0.9% FLUSH: 3.0000 mL | INTRAVENOUS | Status: DC | PRN

## 2017-10-09 MED ORDER — OXYCODONE-ACETAMINOPHEN 5-325 MG PO TABS: 2.0000 | ORAL_TABLET | ORAL | Status: DC | PRN

## 2017-10-09 MED ORDER — ONDANSETRON HCL 4 MG/2ML IJ SOLN: 4.0000 mg | Freq: Once | INTRAMUSCULAR | Status: DC | PRN

## 2017-10-09 MED ORDER — EPHEDRINE SULFATE 50 MG/ML IJ SOLN
INTRAMUSCULAR | Status: DC | PRN
  Administered 2017-10-09: 10 mg via INTRAVENOUS

## 2017-10-09 MED ORDER — MORPHINE SULFATE (PF) 0.5 MG/ML IJ SOLN
INTRAMUSCULAR | Status: DC | PRN
  Administered 2017-10-09: .2 mg via INTRATHECAL

## 2017-10-09 MED ORDER — MEPERIDINE HCL 25 MG/ML IJ SOLN: 6.2500 mg | INTRAMUSCULAR | Status: DC | PRN

## 2017-10-09 MED ORDER — DIPHENHYDRAMINE HCL 50 MG/ML IJ SOLN: 12.5000 mg | INTRAMUSCULAR | Status: DC | PRN

## 2017-10-09 MED ORDER — SIMETHICONE 80 MG PO CHEW
80.0000 mg | CHEWABLE_TABLET | ORAL | Status: DC
  Administered 2017-10-10 (×2): 80 mg via ORAL
  Filled 2017-10-09 (×3): qty 1

## 2017-10-09 MED ORDER — FENTANYL CITRATE (PF) 100 MCG/2ML IJ SOLN
INTRAMUSCULAR | Status: DC | PRN
  Administered 2017-10-09: 20 ug via INTRATHECAL

## 2017-10-09 MED ORDER — SOD CITRATE-CITRIC ACID 500-334 MG/5ML PO SOLN
30.0000 mL | Freq: Once | ORAL | Status: AC
  Administered 2017-10-09: 30 mL via ORAL

## 2017-10-09 MED ORDER — PHENYLEPHRINE 8 MG IN D5W 100 ML (0.08MG/ML) PREMIX OPTIME
INJECTION | INTRAVENOUS | Status: AC
  Filled 2017-10-09: qty 100

## 2017-10-09 MED ORDER — CEFAZOLIN SODIUM-DEXTROSE 2-4 GM/100ML-% IV SOLN
2.0000 g | INTRAVENOUS | Status: DC
  Filled 2017-10-09: qty 100

## 2017-10-09 MED ORDER — WITCH HAZEL-GLYCERIN EX PADS: 1.0000 "application " | MEDICATED_PAD | CUTANEOUS | Status: DC | PRN

## 2017-10-09 MED ORDER — FENTANYL CITRATE (PF) 100 MCG/2ML IJ SOLN: 25.0000 ug | INTRAMUSCULAR | Status: DC | PRN

## 2017-10-09 MED ORDER — OXYTOCIN 10 UNIT/ML IJ SOLN
INTRAMUSCULAR | Status: AC
  Filled 2017-10-09: qty 4

## 2017-10-09 MED ORDER — DEXTROSE 5 % IV SOLN: 1.0000 ug/kg/h | INTRAVENOUS | Status: DC | PRN

## 2017-10-09 MED ORDER — BUPIVACAINE-EPINEPHRINE (PF) 0.25% -1:200000 IJ SOLN
INTRAMUSCULAR | Status: AC
  Filled 2017-10-09: qty 30

## 2017-10-09 MED ORDER — DEXAMETHASONE SODIUM PHOSPHATE 10 MG/ML IJ SOLN
INTRAMUSCULAR | Status: DC | PRN
  Administered 2017-10-09: 4 mg via INTRAVENOUS

## 2017-10-09 MED ORDER — KETOROLAC TROMETHAMINE 30 MG/ML IJ SOLN
30.0000 mg | Freq: Four times a day (QID) | INTRAMUSCULAR | Status: AC | PRN
  Administered 2017-10-09: 30 mg via INTRAVENOUS
  Filled 2017-10-09: qty 1

## 2017-10-09 MED ORDER — DIPHENHYDRAMINE HCL 25 MG PO CAPS: 25.0000 mg | ORAL_CAPSULE | ORAL | Status: DC | PRN

## 2017-10-09 MED ORDER — FENTANYL CITRATE (PF) 100 MCG/2ML IJ SOLN
INTRAMUSCULAR | Status: AC
  Filled 2017-10-09: qty 2

## 2017-10-09 MED ORDER — NALBUPHINE HCL 10 MG/ML IJ SOLN: 5.0000 mg | Freq: Once | INTRAMUSCULAR | Status: DC | PRN

## 2017-10-09 MED ORDER — NALBUPHINE HCL 10 MG/ML IJ SOLN: 5.0000 mg | INTRAMUSCULAR | Status: DC | PRN

## 2017-10-09 MED ORDER — SENNOSIDES-DOCUSATE SODIUM 8.6-50 MG PO TABS
2.0000 | ORAL_TABLET | ORAL | Status: DC
  Administered 2017-10-10 (×2): 2 via ORAL
  Filled 2017-10-09 (×3): qty 2

## 2017-10-09 MED ORDER — DEXAMETHASONE SODIUM PHOSPHATE 4 MG/ML IJ SOLN
INTRAMUSCULAR | Status: AC
  Filled 2017-10-09: qty 1

## 2017-10-09 MED ORDER — SODIUM CHLORIDE 0.9 % IR SOLN
Status: DC | PRN
  Administered 2017-10-09: 1000 mL

## 2017-10-09 MED ORDER — TETANUS-DIPHTH-ACELL PERTUSSIS 5-2.5-18.5 LF-MCG/0.5 IM SUSP: 0.5000 mL | Freq: Once | INTRAMUSCULAR | Status: DC

## 2017-10-09 MED ORDER — MORPHINE SULFATE (PF) 0.5 MG/ML IJ SOLN
INTRAMUSCULAR | Status: AC
  Filled 2017-10-09: qty 10

## 2017-10-09 MED ORDER — MEASLES, MUMPS & RUBELLA VAC ~~LOC~~ INJ
0.5000 mL | INJECTION | Freq: Once | SUBCUTANEOUS | Status: DC
  Filled 2017-10-09: qty 0.5

## 2017-10-09 MED ORDER — SCOPOLAMINE 1 MG/3DAYS TD PT72
1.0000 | MEDICATED_PATCH | Freq: Once | TRANSDERMAL | Status: DC
  Administered 2017-10-09: 1.5 mg via TRANSDERMAL

## 2017-10-09 MED ORDER — IBUPROFEN 600 MG PO TABS
600.0000 mg | ORAL_TABLET | Freq: Four times a day (QID) | ORAL | Status: DC
  Administered 2017-10-09 – 2017-10-11 (×8): 600 mg via ORAL
  Filled 2017-10-09 (×8): qty 1

## 2017-10-09 MED ORDER — KETOROLAC TROMETHAMINE 30 MG/ML IJ SOLN: 30.0000 mg | Freq: Four times a day (QID) | INTRAMUSCULAR | Status: AC | PRN

## 2017-10-09 MED ORDER — OXYTOCIN 10 UNIT/ML IJ SOLN
INTRAVENOUS | Status: DC | PRN
  Administered 2017-10-09: 40 [IU] via INTRAVENOUS

## 2017-10-09 MED ORDER — LACTATED RINGERS IV SOLN
INTRAVENOUS | Status: DC
  Administered 2017-10-09: 1 mL via INTRAVENOUS

## 2017-10-09 MED ORDER — ONDANSETRON HCL 4 MG/2ML IJ SOLN: 4.0000 mg | Freq: Three times a day (TID) | INTRAMUSCULAR | Status: DC | PRN

## 2017-10-09 MED ORDER — CEFAZOLIN SODIUM-DEXTROSE 2-3 GM-%(50ML) IV SOLR
INTRAVENOUS | Status: DC | PRN
  Administered 2017-10-09: 2 g via INTRAVENOUS

## 2017-10-09 MED ORDER — ONDANSETRON HCL 4 MG/2ML IJ SOLN
INTRAMUSCULAR | Status: AC
  Filled 2017-10-09: qty 2

## 2017-10-09 MED ORDER — PHENYLEPHRINE HCL 10 MG/ML IJ SOLN
INTRAMUSCULAR | Status: DC | PRN
  Administered 2017-10-09: 120 ug via INTRAVENOUS

## 2017-10-09 MED ORDER — COCONUT OIL OIL
1.0000 "application " | TOPICAL_OIL | Status: DC | PRN
  Filled 2017-10-09: qty 120

## 2017-10-09 MED ORDER — PHENYLEPHRINE 8 MG IN D5W 100 ML (0.08MG/ML) PREMIX OPTIME
INJECTION | INTRAVENOUS | Status: DC | PRN
  Administered 2017-10-09: 60 ug/min via INTRAVENOUS

## 2017-10-09 MED ORDER — DIBUCAINE 1 % RE OINT: 1.0000 "application " | TOPICAL_OINTMENT | RECTAL | Status: DC | PRN

## 2017-10-09 MED ORDER — SOD CITRATE-CITRIC ACID 500-334 MG/5ML PO SOLN
ORAL | Status: AC
  Filled 2017-10-09: qty 15

## 2017-10-09 MED ORDER — ONDANSETRON HCL 4 MG/2ML IJ SOLN
INTRAMUSCULAR | Status: DC | PRN
  Administered 2017-10-09: 4 mg via INTRAVENOUS

## 2017-10-09 MED ORDER — LACTATED RINGERS IV SOLN
INTRAVENOUS | Status: DC | PRN
  Administered 2017-10-09: 08:00:00 via INTRAVENOUS

## 2017-10-09 MED ORDER — NALBUPHINE HCL 10 MG/ML IJ SOLN
5.0000 mg | INTRAMUSCULAR | Status: DC | PRN
  Administered 2017-10-09: 5 mg via INTRAVENOUS
  Filled 2017-10-09: qty 1

## 2017-10-09 MED ORDER — OXYTOCIN 40 UNITS IN LACTATED RINGERS INFUSION - SIMPLE MED: 2.5000 [IU]/h | INTRAVENOUS | Status: AC

## 2017-10-09 MED ORDER — PRENATAL MULTIVITAMIN CH
1.0000 | ORAL_TABLET | Freq: Every day | ORAL | Status: DC
  Administered 2017-10-09 – 2017-10-11 (×3): 1 via ORAL
  Filled 2017-10-09 (×3): qty 1

## 2017-10-09 MED ORDER — GLYCOPYRROLATE 0.2 MG/ML IJ SOLN
INTRAMUSCULAR | Status: DC | PRN
  Administered 2017-10-09: 0.2 mg via INTRAVENOUS

## 2017-10-09 MED ORDER — ACETAMINOPHEN 325 MG PO TABS: 650.0000 mg | ORAL_TABLET | ORAL | Status: DC | PRN

## 2017-10-09 SURGICAL SUPPLY — 29 items
APL SKNCLS STERI-STRIP NONHPOA (GAUZE/BANDAGES/DRESSINGS) ×1
BENZOIN TINCTURE PRP APPL 2/3 (GAUZE/BANDAGES/DRESSINGS) ×2 IMPLANT
CHLORAPREP W/TINT 26ML (MISCELLANEOUS) ×3 IMPLANT
CLAMP CORD UMBIL (MISCELLANEOUS) IMPLANT
CLOSURE STERI STRIP 1/2 X4 (GAUZE/BANDAGES/DRESSINGS) ×2 IMPLANT
CLOTH BEACON ORANGE TIMEOUT ST (SAFETY) ×3 IMPLANT
DRSG OPSITE POSTOP 4X10 (GAUZE/BANDAGES/DRESSINGS) ×3 IMPLANT
ELECT REM PT RETURN 9FT ADLT (ELECTROSURGICAL) ×3
ELECTRODE REM PT RTRN 9FT ADLT (ELECTROSURGICAL) ×1 IMPLANT
EXTRACTOR VACUUM M CUP 4 TUBE (SUCTIONS) IMPLANT
EXTRACTOR VACUUM M CUP 4' TUBE (SUCTIONS)
GLOVE BIOGEL PI IND STRL 7.0 (GLOVE) ×1 IMPLANT
GLOVE BIOGEL PI INDICATOR 7.0 (GLOVE) ×2
GLOVE ECLIPSE 7.0 STRL STRAW (GLOVE) ×6 IMPLANT
GOWN STRL REUS W/TWL LRG LVL3 (GOWN DISPOSABLE) ×6 IMPLANT
KIT ABG SYR 3ML LUER SLIP (SYRINGE) IMPLANT
NDL HYPO 25X5/8 SAFETYGLIDE (NEEDLE) IMPLANT
NEEDLE HYPO 25X5/8 SAFETYGLIDE (NEEDLE) IMPLANT
NS IRRIG 1000ML POUR BTL (IV SOLUTION) ×3 IMPLANT
PACK C SECTION WH (CUSTOM PROCEDURE TRAY) ×3 IMPLANT
PAD OB MATERNITY 4.3X12.25 (PERSONAL CARE ITEMS) ×3 IMPLANT
SUT MNCRL 0 VIOLET CTX 36 (SUTURE) ×3 IMPLANT
SUT MON AB 2-0 CT1 27 (SUTURE) ×6 IMPLANT
SUT MONOCRYL 0 CTX 36 (SUTURE) ×8
SUT PLAIN 0 NONE (SUTURE) IMPLANT
SUT PLAIN 2 0 XLH (SUTURE) ×2 IMPLANT
SUT VIC AB 4-0 KS 27 (SUTURE) ×2 IMPLANT
TOWEL OR 17X24 6PK STRL BLUE (TOWEL DISPOSABLE) ×3 IMPLANT
TRAY FOLEY W/BAG SLVR 14FR LF (SET/KITS/TRAYS/PACK) IMPLANT

## 2017-10-09 NOTE — Lactation Note (Signed)
This note was copied from a baby's chart. Lactation Consultation Note  Patient Name: Miranda Michail JewelsDeborah Butkiewicz ZOXWR'UToday's Date: 10/09/2017   Initial visit at 10 hours of life. Mom is P1 who used IVF. She is pleased with how well "Minus LibertyRosie" is nursing [facility]("Rosie" has already gone to the breast 4 times & has had good output).   Mom reports + breast changes w/pregnancy. She had breast augmentation years ago, with implants "under the muscle." Mom reports having been a "B" cup and wanting to go to a "C" cup. She denies asymmetry.  Mom made aware of O/P services, breastfeeding support groups, community resources, and our phone # for post-discharge questions.   A pacifier was noted at the bedside. Mom educated on reasons to delay pacifier use.   Lurline HareRichey, Fantasy Donald [facility] 10/09/2017, 6:46 PM

## 2017-10-09 NOTE — Transfer of Care (Cosign Needed)
Immediate Anesthesia Transfer of Care Note  Patient: Miranda GableDeborah D Billiot  Procedure(s) Performed: CESAREAN SECTION (N/A )  Patient Location: PACU  Anesthesia Type:Spinal  Level of Consciousness: awake, alert , oriented and patient cooperative  Airway & Oxygen Therapy: Patient Spontanous Breathing  Post-op Assessment: Report given to RN and Post -op Vital signs reviewed and stable  Post vital signs: Reviewed and stable  Last Vitals:  Vitals Value Taken Time  BP    Temp    Pulse    Resp    SpO2      Last Pain:  Vitals:   10/09/17 0605  TempSrc: Oral  PainSc: 0-No pain         Complications: No apparent anesthesia complications

## 2017-10-09 NOTE — Anesthesia Postprocedure Evaluation (Signed)
Anesthesia Post Note  Patient: Miranda GableDeborah D Hoover  Procedure(s) Performed: CESAREAN SECTION (N/A )     Patient location during evaluation: PACU Anesthesia Type: Spinal Level of consciousness: oriented and awake and alert Pain management: pain level controlled Vital Signs Assessment: post-procedure vital signs reviewed and stable Respiratory status: spontaneous breathing, respiratory function stable and nonlabored ventilation Cardiovascular status: blood pressure returned to baseline and stable Postop Assessment: no headache, no backache, no apparent nausea or vomiting, spinal receding and patient able to bend at knees Anesthetic complications: no    Last Vitals:  Vitals:   10/09/17 0945 10/09/17 1000  BP: (!) 111/50 102/66  Pulse: 68 64  Resp: (!) 24 13  Temp:  (!) 35.6 C  SpO2: 98% 99%    Last Pain:  Vitals:   10/09/17 1000  TempSrc: Axillary  PainSc: 0-No pain   Pain Goal:                 Lawana Hartzell A.

## 2017-10-09 NOTE — H&P (Signed)
Miranda GableDeborah D Hoover is an 47 y.o. G3P0020 3227w1d white female who presents to the OR for a primary C/S for Breech. This preg is an IVF preg with a donor egg. Genotype on Egg 46XX. 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 PNC was uncomplicated.  Chief Complaint: HPI:  Past Medical History:  Diagnosis Date  . Depression   . Frequent episodic tension-type headache   . Newborn product of in vitro fertilization (IVF) pregnancy   . Sinus polyp   . Uterus arcuatus   . Vaginal Pap smear, abnormal     Past Surgical History:  Procedure Laterality Date  . BREAST ENHANCEMENT SURGERY    . BUNIONECTOMY Right 2013  . DILATION AND CURETTAGE OF UTERUS    . LEEP    . NASAL SINUS SURGERY     x3  . OVARIAN CYST SURGERY    . TONSILLECTOMY AND ADENOIDECTOMY      Family History  Problem Relation Age of Onset  . Arthritis Father   . Hyperlipidemia Father   . Hypertension Father   . Diabetes Father   . Arthritis Maternal Grandmother   . Uterine cancer Maternal Grandmother   . Heart disease Maternal Grandmother   . Hypertension Maternal Grandmother   . Stroke Paternal Grandmother   . Hypertension Paternal Grandmother   . Diabetes Paternal Grandmother   . Diabetes Paternal Aunt   . Diabetes Paternal Uncle   . Atrial fibrillation Mother   . Colon cancer Neg Hx   . Breast cancer Neg Hx    Social History:  reports that she has never smoked. She has never used smokeless tobacco. She reports that she drinks alcohol. She reports that she does not use drugs.  Allergies:  Allergies  Allergen Reactions  . Amitriptyline Rash    Medications Prior to Admission  Medication Sig Dispense Refill  . cetirizine (ZYRTEC) 10 MG tablet Take 10 mg by mouth daily.     . fluticasone (FLONASE) 50 MCG/ACT nasal spray Place 2 sprays into both nostrils daily. USE 2 SPRAYS NASALLY DAILY 32 g 0  . guaiFENesin (MUCINEX) 600 MG 12 hr tablet Take 600 mg by mouth 2 (two) times daily as needed for cough or to loosen  phlegm.    Marland Kitchen. omeprazole (PRILOSEC OTC) 20 MG tablet Take 20 mg by mouth daily.    Bertram Gala. Polyethyl Glycol-Propyl Glycol (SYSTANE OP) Place 1 drop into both eyes daily.    . Prenatal Vit-Fe Fumarate-FA (PRENATAL PO) Take 1 tablet by mouth daily.    Marland Kitchen. FLUoxetine (PROZAC) 20 MG capsule Take 1 capsule (20 mg total) by mouth daily. (Patient not taking: Reported on 10/02/2017) 90 capsule 3       Blood pressure 118/77, pulse 75, temperature 98.2 F (36.8 C), temperature source Oral, resp. rate 18, height 5\' 5"  (1.651 m), weight 180 lb 3.2 oz (81.7 kg). General appearance: alert and cooperative Abdomen: gravid, non tender   Lab Results  Component Value Date   WBC 9.0 10/06/2017   HGB 12.8 10/06/2017   HCT 37.4 10/06/2017   MCV 86.0 10/06/2017   PLT 188 10/06/2017   Lab Results  Component Value Date   PREGTESTUR  09/02/2007    NEGATIVE        THE SENSITIVITY OF THIS METHODOLOGY IS >24 mIU/mL    Patient Active Problem List   Diagnosis Date Noted  . Female infertility 08/11/2017  . PCP NOTES>>>>>>>>>>>>>>> 08/01/2016  . Clinical depression 10/18/2013  . Constipation 12/28/2010  . General  medical examination 12/28/2010  . Immunity status testing 12/28/2010   IMP/ IUP at term with Breech presentation         AMA         Arcuate uterus         Donor egg Plan/ Proceed with C/S  ANDERSON,MARK E 10/09/2017, 7:25 AM

## 2017-10-09 NOTE — Consult Note (Signed)
Neonatology Note:   Attendance at C-section:    I was asked by Dr. Dareen PianoAnderson to attend this C/S at term for breech. The mother is a GG3P0020, GBS neg with good prenatal care. Pregnancy complicated by IVF, depression, AMA.  ROM 0 hours before delivery, fluid clear. Infant vigorous with good spontaneous cry and tone. 3 vessel cord; no DCC. Needed only minimal bulb suctioning. Ap 8/9. Lungs clear to ausc in DR. To CN to care of Pediatrician.  Dineen Kidavid C. Leary RocaEhrmann, MD

## 2017-10-09 NOTE — Anesthesia Postprocedure Evaluation (Signed)
Anesthesia Post Note  Patient: Miranda GableDeborah D Hoover  Procedure(s) Performed: CESAREAN SECTION (N/A )     Patient location during evaluation: Mother Baby Anesthesia Type: Spinal Level of consciousness: awake and alert and oriented Pain management: satisfactory to patient Vital Signs Assessment: post-procedure vital signs reviewed and stable Respiratory status: respiratory function stable and spontaneous breathing Cardiovascular status: blood pressure returned to baseline Postop Assessment: no headache, no backache, spinal receding, patient able to bend at knees and adequate PO intake Anesthetic complications: no    Last Vitals:  Vitals:   10/09/17 1245 10/09/17 1300  BP: 112/72   Pulse: 98   Resp: 17   Temp: 36.8 C 36.7 C  SpO2: 100%     Last Pain:  Vitals:   10/09/17 1404  TempSrc:   PainSc: 3    Pain Goal:                 Koben Daman

## 2017-10-09 NOTE — Anesthesia Procedure Notes (Signed)
Spinal  Patient location during procedure: OR Start time: 10/09/2017 7:28 AM Staffing Anesthesiologist: Mal AmabileFoster, Jill Stopka, MD Performed: anesthesiologist  Preanesthetic Checklist Completed: patient identified, site marked, surgical consent, pre-op evaluation, timeout performed, IV checked, risks and benefits discussed and monitors and equipment checked Spinal Block Patient position: sitting Prep: site prepped and draped and DuraPrep Patient monitoring: heart rate, cardiac monitor, continuous pulse ox and blood pressure Approach: midline Location: L3-4 Injection technique: single-shot Needle Needle type: Pencan  Needle gauge: 24 G Needle length: 9 cm Needle insertion depth: 6 cm Assessment Sensory level: T4 Events: paresthesia Additional Notes Patient tolerated procedure well. Transient paresthesia left leg. Adequate sensory level.

## 2017-10-09 NOTE — Addendum Note (Signed)
Addendum  created 10/09/17 1415 by Graciela HusbandsFussell, Yael Coppess O, CRNA   Sign clinical note

## 2017-10-09 NOTE — Op Note (Signed)
NAMMarcellina Millin:  Hoover, Miranda Hoover            ACCOUNT NO.:  [id number]  MEDICAL RECORD NO.:  [id number]  LOCATION:                                 FACILITY:  PHYSICIAN:  Malva LimesMark Annet Manukyan, M.D.         DATE OF BIRTH:  DATE OF PROCEDURE:  10/09/2017 DATE OF DISCHARGE:                              OPERATIVE REPORT   PREOPERATIVE DIAGNOSES: 1. Intrauterine pregnancy at term. 2. Advanced maternal age. 3. Pregnancy results of in vitro fertilization and donor egg. 4. Persistent breech presentation.  POSTOPERATIVE DIAGNOSES:  In Vitro Fertilization  PROCEDURE:  Primary low transverse cesarean section.  SURGEON:  Malva LimesMark Brazos Sandoval, M.D.  ASSISTANT:  Dr. Arlyce DiceKaplan.  ANESTHESIA:  Spinal.  ANTIBIOTICS:  Ancef 2 g, IV piggyback.  DRAINS:  Foley at bedside drainage.  ESTIMATED BLOOD LOSS:  900 mL.  SPECIMENS:  None.  FINDINGS:  The patient had normal fallopian tubes and ovaries bilaterally.  DESCRIPTION OF PROCEDURE:  The patient was taken to the operating room where spinal anesthetic was administered without difficulty.  She was then placed in the dorsal supine position with a left lateral tilt. Once an adequate anesthetic level was reached, she was prepped and draped in the usual fashion for this procedure.  A Pfannenstiel incision was then made on entering the abdominal cavity.  The bladder flap was taken down with sharp dissection.  A low-transverse uterine incision was made in the lower uterine segment in the midline and extended laterally with blunt dissection.  Amniotic sac was ruptured with the Metzenbaum scissors.  The infant was delivered in the breech presentation.  On delivery of the head, the oropharynx and nostrils were bulb suctioned. The cord was doubly clamped and cut, and the infant was handed to the awaiting NICU team.  The placenta was then manually removed.  The uterus was exteriorized.  Uterine cavity was wiped with a wet lap and examined. The uterine incision was closed in a  single layer of 0 Monocryl suture in a running locking fashion.  The bladder flap was closed using 2-0 Monocryl suture in a running fashion.  The uterus was placed back in abdominal cavity.  Hemostasis was checked and felt to be adequate.  The parietal peritoneum and rectus muscles were reapproximated in the midline with 2-0 Monocryl suture in a running fashion.  The fascia was closed using 0 Monocryl suture in a running fashion. Subcuticular tissue was irrigated and closed with interrupted 3-0 plain gut suture.  The skin was closed with 4-0 Monocryl suture in a subcuticular fashion and Steri-Strips were then placed.  The instrument and lap count were correct x3.  The patient was taken to recovery room in stable condition.    ______________________________ Malva LimesMark Eyan Hagood, M.D.   ______________________________ Malva LimesMark Makinzy Cleere, M.D.    MA/MEDQ  D:  10/09/2017  T:  10/09/2017  Job:  [id number]

## 2017-10-10 LAB — CBC
HCT: 28.6 % — ABNORMAL LOW (ref 36.0–46.0)
Hemoglobin: 9.8 g/dL — ABNORMAL LOW (ref 12.0–15.0)
MCH: 29.5 pg (ref 26.0–34.0)
MCHC: 34.3 g/dL (ref 30.0–36.0)
MCV: 86.1 fL (ref 78.0–100.0)
PLATELETS: 182 10*3/uL (ref 150–400)
RBC: 3.32 MIL/uL — AB (ref 3.87–5.11)
RDW: 14.1 % (ref 11.5–15.5)
WBC: 14 10*3/uL — AB (ref 4.0–10.5)

## 2017-10-10 LAB — BIRTH TISSUE RECOVERY COLLECTION (PLACENTA DONATION)

## 2017-10-10 MED ORDER — SALINE SPRAY 0.65 % NA SOLN
1.0000 | NASAL | Status: DC | PRN
  Administered 2017-10-10: 1 via NASAL
  Filled 2017-10-10: qty 44

## 2017-10-10 NOTE — Progress Notes (Signed)
POD#1 Pt is doing well. She is ambulating well. Tolerating diet VSSAF CBC wnl IMP/ Stable Plan/ Routine care           Probable discharge tomorrow.

## 2017-10-10 NOTE — Lactation Note (Signed)
This note was copied from a baby's chart. Lactation Consultation Note  Patient Name: Miranda Hoover ZOXWR'UToday's Date: 10/10/2017 Reason for consult: Follow-up assessment;Term;Infant < 6lbs   Follow up with mom of 30 hour old infant. Infant with 7 BF for 10-30 minuets, 1 formula feed of 15 cc, 3 voids and 7 stools in the last 24 hours. Infant weight 5 pounds 6 ounces with 4% weight loss since birth. LATCH scores 6-10.   Infant laying in the crib and cueing to feed, enc mom to feed infant. Mom reports infant fed an hour ago. Mom got up and put pacifier in infant mouth. Mom reports infant just ate an hour ago. Discussed it is normal for infants to cluster feed and enc mom to feed at first feeding cues.   Offered mom DEBP, she was agreeable to pumping. DEBP set up with instructions for assembling, disassembling and cleaning of pump parts. Enc mom to pump post BF and follow with hand expression. Showed mom how to hand express, colostrum not easily expressible at this time. Enc mom to feed any EBM prior to offering formula. Reviewed breast milk storage at room temperature.   Mom reports she has no further questions/concerns at this time. Enc mom to call out for feeding assistance as needed. Report to Dolly RiasKim Isley, RN.      Maternal Data Formula Feeding for Exclusion: No Has patient been taught Hand Expression?: Yes Does the patient have breastfeeding experience prior to this delivery?: No  Feeding    LATCH Score                   Interventions    Lactation Tools Discussed/Used Pump Review: Setup, frequency, and cleaning;Milk Storage Initiated by:: Noralee StainSharon Lona Six, RN, IBCLC Date initiated:: 10/10/17   Consult Status Consult Status: Follow-up Date: 10/11/17 Follow-up type: In-patient    Miranda Hoover 10/10/2017, 3:09 PM

## 2017-10-11 MED ORDER — IBUPROFEN 600 MG PO TABS: 600.0000 mg | ORAL_TABLET | Freq: Four times a day (QID) | ORAL | 0 refills | Status: DC | PRN

## 2017-10-11 MED ORDER — OXYCODONE-ACETAMINOPHEN 5-325 MG PO TABS: 1.0000 | ORAL_TABLET | Freq: Four times a day (QID) | ORAL | 0 refills | Status: DC | PRN

## 2017-10-11 MED ORDER — DOCUSATE SODIUM 100 MG PO CAPS: 100.0000 mg | ORAL_CAPSULE | Freq: Two times a day (BID) | ORAL | 0 refills | Status: DC

## 2017-10-11 NOTE — Discharge Summary (Signed)
Obstetric Discharge Summary Reason for Admission: cesarean section Prenatal Procedures: NST and ultrasound Intrapartum Procedures: cesarean: low cervical, transverse Postpartum Procedures: none Complications-Operative and Postpartum: none Hemoglobin  Date Value Ref Range Status  10/10/2017 9.8 (L) 12.0 - 15.0 g/dL Final  16/10/960402/13/2018 54.011.4 11.1 - 15.9 g/dL Final   HCT  Date Value Ref Range Status  10/10/2017 28.6 (L) 36.0 - 46.0 % Final   Hematocrit  Date Value Ref Range Status  08/02/2016 34.6 34.0 - 46.6 % Final    Physical Exam:  General: alert, cooperative and appears stated age 55Lochia: appropriate Uterine Fundus: firm Incision: healing well DVT Evaluation: No evidence of DVT seen on physical exam.  Discharge Diagnoses: Term Pregnancy-delivered  Discharge Information: Date: 10/11/2017 Activity: pelvic rest Diet: routine Medications: Ibuprofen, Colace and Percocet Condition: improved Instructions: refer to practice specific booklet Discharge to: home Follow-up Information    Levi AlandAnderson, Mark E, MD Follow up in 4 week(s).   Specialty:  Obstetrics and Gynecology Why:  For a postpartum evaluation Contact information: [address] [place] [phone]           Newborn Data: Live born female  Birth Weight: 5 lb 10.1 oz (2555 g) APGAR: 8, 9  Newborn Delivery   Birth date/time:  10/09/2017 07:52:00 Delivery type:  C-Section, Low Transverse Trial of labor:  No C-section categorization:  Primary     Home with mother.  Waynard ReedsKendra Nicholi Ghuman 10/11/2017, 9:40 AM

## 2017-10-11 NOTE — Progress Notes (Signed)
Reassessed mom breast tissue post-pumping.  Breast tissue had softened and mom stated that engorgement symptoms were not worsening.  Educated on preventative measures and treatment of engorgement.  No questions or concerns at this time.

## 2017-10-11 NOTE — Lactation Note (Signed)
This note was copied from a baby's chart. Lactation Consultation Note  Patient Name: Miranda Hoover WUJWJ'XToday's Date: 10/11/2017 Reason for consult: Follow-up assessment;Term;Infant < 6lbs;Other (Comment);Breast augmentation(milk is in / and mom is engorged / see LC note )  Baby is 8952 hours old  LC reviewed doc flow sheets.  Mom plan is to just pump and bottle feed.  LC reassured mom it is a good sign her milk is in. LC provided 3 re- useable ice packs one for each side and  On the top of breast. Breast are full in some areas  And engorged on the alteral aspects and in the anterior portions  Of each breast. [facility] and instructions to ice for 15 -20 mins both breast/ pump for 15 -20 mins , and then call on the nurses light for [facility] Claris Che( Versia Mignogna ) to recheck breast before D/C.  Mom denies sore nipples and the  use of the #24 Flange. LC reviewed sore nipple and engorgement prevention and tx  And the importance of consistent pumping at least 8 x's a day and it may be 10 depending how quickly her breast fill up. LC reminded mom she has 2 - #27 flanges if the #24's are uncomfortable when pumping if breast are really full or engorged.  Per mom has  DEBP Medela at home.   Per RN mom has not consistent with pumping.     Maternal Data    Feeding Feeding Type: Bottle Fed - Breast Milk Nipple Type: Slow - flow  LATCH Score          Comfort (Breast/Nipple): Engorged, cracked, bleeding, large blisters, severe discomfort        Interventions Interventions: Breast feeding basics reviewed;Ice(3 reuseable ice packs provided and instructed mom to apply them to her her breast for 15 -20 mins )  Lactation Tools Discussed/Used WIC Program: No   Consult Status Consult Status: Follow-up Date: 10/11/17 Follow-up type: In-patient    Miranda SprangMargaret Ann Alsie Hoover 10/11/2017, 11:53 AM

## 2017-10-11 NOTE — Progress Notes (Signed)
MOB was referred for history of depression/anxiety. * Referral screened out by Clinical Social Worker because none of the following criteria appear to apply: ~ History of anxiety/depression during this pregnancy, or of post-partum depression. ~ Diagnosis of anxiety and/or depression within last 3 years OR * MOB's symptoms currently being treated with medication and/or therapy. Please contact the Clinical Social Worker if needs arise, by MOB request, or if MOB scores greater than 9/yes to question 10 on Edinburgh Postpartum Depression Screen.  MOB has Rx for Prozac. 

## 2017-10-11 NOTE — Lactation Note (Signed)
This note was copied from a baby's chart. Lactation Consultation Note  Patient Name: Girl Michail JewelsDeborah Steege DGUYQ'IToday's Date: 10/11/2017 Reason for consult: Follow-up assessment;Term;Infant < 6lbs;Other (Comment);Breast augmentation(milk is in / and mom is engorged / see LC note )  Per RN Selena BattenKim , mom was checked after she pumped and her breast were softer. Mom didn't call LC to check breast,    Maternal Data    Feeding Feeding Type: Bottle Fed - Breast Milk Nipple Type: Slow - flow  LATCH Score          Comfort (Breast/Nipple): Engorged, cracked, bleeding, large blisters, severe discomfort        Interventions Interventions: Breast feeding basics reviewed;Ice(3 reuseable ice packs provided and instructed mom to apply them to her her breast for 15 -20 mins )  Lactation Tools Discussed/Used WIC Program: No   Consult Status Consult Status: Follow-up Date: 10/11/17 Follow-up type: In-patient    Matilde SprangMargaret Ann Ada Holness 10/11/2017, 12:56 PM

## 2017-10-12 ENCOUNTER — Telehealth (HOSPITAL_COMMUNITY): Payer: Self-pay | Admitting: Lactation Services

## 2017-10-12 NOTE — Telephone Encounter (Signed)
Outgoing Phone call:  Per Patient was D/C yesterday and she is having problems with her breast being engorged.  She has tried icing, massaging towards the nipple, baby has not latched since yesterday so  She has been receiving formula in a bottle. When mom pumps she only get off a very little amount.  Per mom has been taking Motrin.   LC reviewed prevention and tx of engorgement.  Full breast are full, heavy, warm, - its normal - time to feed the baby or pump to release breast down so  Engorgement can be prevented. Prevention is the key .   If breast are greater than full - tight- firm- with nodules ( firm to hard ) will need to ice for 15 - 20 mins with bags of  Frozen vegetables wrapped in paper towels lying down flat or number 3-4 pampers that have been frozen ahead of  Time.  After icing with the help of support person, lay down, warm coconut oil and massage from nipples backwards to  Chest walk ( like a breast exam ) with long strokes going in different directions towards the chest wall.  The milk should start flowing. If not apply warm moist wash clothes to both breast for 5-10 mins to help milk ducts relax and repeat massage.  If poor relieve - try cold cabbage leaves after rinsing with cold water and activating the veins in the cabbage ( enzyme )  2 large leaves around each breast, ice packs on top for 10 -15 mins , then alternating massage.   If you are able to release enough milk off with hand expressing. Pre-pumping, and support person lay baby in laid back and attempt to latch ( baby is the best to relieve engorgement).  If baby latches , allow her to feed long enough to soften 1st breast. If she is still hungry latch her on the the 2nd breast,  If not pump and hand express down to pre-pregnancy size to completely improve engorgement.  When engorgement has been relieved - feed baby with feeding cues and at least 8 x's a day.   LC recommended trying the above plan and if not  working to call back if after 9 pm give permission to call her back.

## 2017-10-15 ENCOUNTER — Inpatient Hospital Stay (HOSPITAL_COMMUNITY): Admission: AD | Admit: 2017-10-15 | Payer: 59 | Source: Ambulatory Visit | Admitting: Obstetrics and Gynecology

## 2017-11-08 ENCOUNTER — Other Ambulatory Visit: Payer: Self-pay | Admitting: Internal Medicine

## 2017-11-08 NOTE — Telephone Encounter (Signed)
Please advise 

## 2017-11-08 NOTE — Telephone Encounter (Signed)
Copied from CRM [id number]. Topic: Quick Communication - See Telephone Encounter >> Nov 08, 2017  4:48 PM Terisa Starr wrote: CRM for notification. See Telephone encounter for: 11/08/17.  Patient said that she just had a baby so she quit taking her FLUoxetine (PROZAC) 20 MG capsule during the pregnancy. She has had the baby and she really wants to be back on the medication. Please advise. If she needs to be seen please call her at [phone]  [facility] [place] [facility] [employer].

## 2017-11-09 MED ORDER — FLUOXETINE HCL 20 MG PO CAPS: 20.0000 mg | ORAL_CAPSULE | Freq: Every day | ORAL | 2 refills | Status: AC

## 2017-11-09 NOTE — Telephone Encounter (Signed)
Patient returned call and says she is no longer breastfeeding, the baby is on formula 100% of the time. She says she is not experiencing any symptoms of postpartum depression and would like the prozac refilled.  Last OV:08/01/16; Upcoming OV:12/07/17 Last refill:10/25/16 90 cap/3 refills PCP:[id number] [place], [place] - [address] [phone] (Phone) [phone] (Fax)

## 2017-11-09 NOTE — Telephone Encounter (Signed)
Please call the patient. If she is breast-feeding, recommend to clear with the pediatrician if it is okay to take fluoxetine If she has severe symptoms like postpartum depression she needs to be seen. Otherwise okay to restart fluoxetine 20 mg, the first week only half tablet. Follow-up in 2 months

## 2017-11-09 NOTE — Telephone Encounter (Signed)
[facility] [place] as requested in first message.

## 2017-11-09 NOTE — Telephone Encounter (Signed)
LMOM informing Pt to return call. Okay for PEC to discuss when she returns call.

## 2017-12-07 ENCOUNTER — Encounter: Payer: 59 | Admitting: Internal Medicine

## 2018-01-22 DIAGNOSIS — J338 Other polyp of sinus: Secondary | ICD-10-CM | POA: Insufficient documentation

## 2018-01-22 DIAGNOSIS — J309 Allergic rhinitis, unspecified: Secondary | ICD-10-CM | POA: Insufficient documentation

## 2018-01-22 DIAGNOSIS — G44209 Tension-type headache, unspecified, not intractable: Secondary | ICD-10-CM | POA: Insufficient documentation

## 2018-01-25 DIAGNOSIS — F419 Anxiety disorder, unspecified: Secondary | ICD-10-CM | POA: Insufficient documentation

## 2018-04-24 ENCOUNTER — Ambulatory Visit (INDEPENDENT_AMBULATORY_CARE_PROVIDER_SITE_OTHER): Payer: 59

## 2018-04-24 ENCOUNTER — Encounter: Payer: Self-pay | Admitting: Podiatry

## 2018-04-24 ENCOUNTER — Ambulatory Visit (INDEPENDENT_AMBULATORY_CARE_PROVIDER_SITE_OTHER): Payer: 59 | Admitting: Podiatry

## 2018-04-24 VITALS — BP 115/64 | HR 81 | Resp 16

## 2018-04-24 DIAGNOSIS — M7752 Other enthesopathy of left foot: Secondary | ICD-10-CM

## 2018-04-24 DIAGNOSIS — M778 Other enthesopathies, not elsewhere classified: Secondary | ICD-10-CM

## 2018-04-24 DIAGNOSIS — M779 Enthesopathy, unspecified: Secondary | ICD-10-CM

## 2018-04-24 DIAGNOSIS — M2012 Hallux valgus (acquired), left foot: Secondary | ICD-10-CM | POA: Diagnosis not present

## 2018-04-24 NOTE — Patient Instructions (Signed)
Pre-Operative Instructions  Congratulations, you have decided to take an important step towards improving your quality of life.  You can be assured that the doctors and staff at [facility] will be with you every step of the way.  Here are some important things you should know:  1. Plan to be at the surgery center/hospital at least 1 (one) hour prior to your scheduled time, unless otherwise directed by the surgical center/hospital staff.  You must have a responsible adult accompany you, remain during the surgery and drive you home.  Make sure you have directions to the surgical center/hospital to ensure you arrive on time. 2. If you are having surgery at [facility] or [facility] hospitals, you will need a copy of your medical history and physical form from your family physician within one month prior to the date of surgery. We will give you a form for your primary physician to complete.  3. We make every effort to accommodate the date you request for surgery.  However, there are times where surgery dates or times have to be moved.  We will contact you as soon as possible if a change in schedule is required.   4. No aspirin/ibuprofen for one week before surgery.  If you are on aspirin, any non-steroidal anti-inflammatory medications (Mobic, Aleve, Ibuprofen) should not be taken seven (7) days prior to your surgery.  You make take Tylenol for pain prior to surgery.  5. Medications - If you are taking daily heart and blood pressure medications, seizure, reflux, allergy, asthma, anxiety, pain or diabetes medications, make sure you notify the surgery center/hospital before the day of surgery so they can tell you which medications you should take or avoid the day of surgery. 6. No food or drink after midnight the night before surgery unless directed otherwise by surgical center/hospital staff. 7. No alcoholic beverages 24-hours prior to surgery.  No smoking 24-hours prior or 24-hours after  surgery. 8. Wear loose pants or shorts. They should be loose enough to fit over bandages, boots, and casts. 9. Don't wear slip-on shoes. Sneakers are preferred. 10. Bring your boot with you to the surgery center/hospital.  Also bring crutches or a walker if your physician has prescribed it for you.  If you do not have this equipment, it will be provided for you after surgery. 11. If you have not been contacted by the surgery center/hospital by the day before your surgery, call to confirm the date and time of your surgery. 12. Leave-time from work may vary depending on the type of surgery you have.  Appropriate arrangements should be made prior to surgery with your employer. 13. Prescriptions will be provided immediately following surgery by your doctor.  Fill these as soon as possible after surgery and take the medication as directed. Pain medications will not be refilled on weekends and must be approved by the doctor. 14. Remove nail polish on the operative foot and avoid getting pedicures prior to surgery. 15. Wash the night before surgery.  The night before surgery wash the foot and leg well with water and the antibacterial soap provided. Be sure to pay special attention to beneath the toenails and in between the toes.  Wash for at least three (3) minutes. Rinse thoroughly with water and dry well with a towel.  Perform this wash unless told not to do so by your physician.  Enclosed: 1 Ice pack (please put in freezer the night before surgery)   1 Hibiclens skin cleaner     Pre-op instructions  If you have any questions regarding the instructions, please do not hesitate to call our office.  [place]: [address], [place], [place] [address] -- [phone]  [place]: [address] [place], [place] [address] -- [phone]  [place]: [address]  [place], [place] [address] -- [phone]  [place]: [address], [address], [place],  [address] -- [phone]  Website:  [email or web address] 

## 2018-04-25 NOTE — Progress Notes (Signed)
Subjective:  Patient ID: Miranda Hoover, female    DOB: Sep 11, 1970,  MRN: [id number] HPI Chief Complaint  Patient presents with  . Foot Pain    1st MPJ left - aching x few months, since weather has changed-wearing enclosed shoes now and makes hurts worse  . New Patient (Initial Visit)    Est pt 3+    47 y.o. female presents with the above complaint.   ROS: Denies fever chills nausea vomiting muscle aches pains calf pain back pain chest pain shortness of breath.  Newborn daughter 28 months old name Minus Liberty Sister Waynetta Sandy will help take care of her.  Past Medical History:  Diagnosis Date  . Depression   . Frequent episodic tension-type headache   . Newborn product of in vitro fertilization (IVF) pregnancy   . Sinus polyp   . Uterus arcuatus   . Vaginal Pap smear, abnormal    Past Surgical History:  Procedure Laterality Date  . BREAST ENHANCEMENT SURGERY    . BUNIONECTOMY Right 2013  . CESAREAN SECTION N/A 10/09/2017   Procedure: CESAREAN SECTION;  Surgeon: Levi Aland, MD;  Location: [facility] BIRTHING SUITES;  Service: Obstetrics;  Laterality: N/A;  Tracey RNFA  . DILATION AND CURETTAGE OF UTERUS    . LEEP    . NASAL SINUS SURGERY     x3  . OVARIAN CYST SURGERY    . TONSILLECTOMY AND ADENOIDECTOMY      Current Outpatient Medications:  .  fluticasone (FLONASE) 50 MCG/ACT nasal spray, Place into both nostrils daily., Disp: , Rfl:  .  cetirizine (ZYRTEC) 10 MG tablet, Take 10 mg by mouth daily., Disp: , Rfl: 6 .  FLUoxetine (PROZAC) 20 MG capsule, Take 1 capsule (20 mg total) by mouth daily. For first week take only 1/2 tablet daily, Disp: 30 capsule, Rfl: 2 .  montelukast (SINGULAIR) 10 MG tablet, TAKE ONE TABLET (10 MG DOSE) BY MOUTH AT BEDTIME, Disp: , Rfl: 5  Allergies  Allergen Reactions  . Amitriptyline Rash   Review of Systems Objective:   Vitals:   04/24/18 1335  BP: 115/64  Pulse: 81  Resp: 16    General: Well developed, nourished, in no acute distress,  alert and oriented x3   Dermatological: Skin is warm, dry and supple bilateral. Nails x 10 are well maintained; remaining integument appears unremarkable at this time. There are no open sores, no preulcerative lesions, no rash or signs of infection present.  Vascular: Dorsalis Pedis artery and Posterior Tibial artery pedal pulses are 2/4 bilateral with immedate capillary fill time. Pedal hair growth present. No varicosities and no lower extremity edema present bilateral.   Neruologic: Grossly intact via light touch bilateral. Vibratory intact via tuning fork bilateral. Protective threshold with Semmes Wienstein monofilament intact to all pedal sites bilateral. Patellar and Achilles deep tendon reflexes 2+ bilateral. No Babinski or clonus noted bilateral.   Musculoskeletal: No gross boney pedal deformities bilateral. No pain, crepitus, or limitation noted with foot and ankle range of motion bilateral. Muscular strength 5/5 in all groups tested bilateral.  Pain on palpation of the first metatarsophalangeal joint of the left foot and on range of motion.  It appears to be tracking but not yet track bound and is easily reducible.  Gait: Unassisted, Nonantalgic.    Radiographs:  Radiographs taken today do demonstrate an increase in the first intermetatarsal angle greater than 10 degrees left foot.  Hallux abductus angle is greater than 10 degrees joint space narrowing some osteoarthritic changes no  spurring at this point.  There is a small irregularity in the shape of the head of the first metatarsal that corresponds with a similar finding on the base of the proximal phalanx.  This may limit full correction.  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  Plan: We discussed the etiology pathology conservative versus surgical therapies.  At this point I consented her for an Jerrol Banana with osteotomy with double screw fixation.  She understands this and  is amenable to it.  She is very happy with the contralateral foot.  We did discuss the possible postop complications which may include but are not limited to postop pain bleeding swelling infection recurrence need further surgery overcorrection under correction loss of digit loss of limb loss of life.  We provided her with both oral and written home-going instructions.  She has her cam walker at home from her previous surgery she will bring that on the day of surgery.  I will follow-up with her in the near future for surgical intervention.     Max T. [address], [place]

## 2018-05-29 ENCOUNTER — Other Ambulatory Visit: Payer: Self-pay | Admitting: Podiatry

## 2018-05-29 MED ORDER — CEPHALEXIN 500 MG PO CAPS: 500.0000 mg | ORAL_CAPSULE | Freq: Three times a day (TID) | ORAL | 0 refills | Status: DC

## 2018-05-29 MED ORDER — ONDANSETRON HCL 4 MG PO TABS: 4.0000 mg | ORAL_TABLET | Freq: Three times a day (TID) | ORAL | 0 refills | Status: DC | PRN

## 2018-05-29 MED ORDER — OXYCODONE-ACETAMINOPHEN 10-325 MG PO TABS: 1.0000 | ORAL_TABLET | Freq: Four times a day (QID) | ORAL | 0 refills | Status: AC | PRN

## 2018-06-01 ENCOUNTER — Encounter: Payer: Self-pay | Admitting: *Deleted

## 2018-06-01 DIAGNOSIS — M2012 Hallux valgus (acquired), left foot: Secondary | ICD-10-CM

## 2018-06-07 ENCOUNTER — Ambulatory Visit (INDEPENDENT_AMBULATORY_CARE_PROVIDER_SITE_OTHER): Payer: 59

## 2018-06-07 ENCOUNTER — Other Ambulatory Visit: Payer: Self-pay | Admitting: Podiatry

## 2018-06-07 ENCOUNTER — Ambulatory Visit (INDEPENDENT_AMBULATORY_CARE_PROVIDER_SITE_OTHER): Payer: 59 | Admitting: Podiatry

## 2018-06-07 ENCOUNTER — Telehealth: Payer: Self-pay | Admitting: Podiatry

## 2018-06-07 DIAGNOSIS — M778 Other enthesopathies, not elsewhere classified: Secondary | ICD-10-CM

## 2018-06-07 DIAGNOSIS — M2012 Hallux valgus (acquired), left foot: Secondary | ICD-10-CM

## 2018-06-07 DIAGNOSIS — M779 Enthesopathy, unspecified: Secondary | ICD-10-CM

## 2018-06-07 MED ORDER — OXYCODONE-ACETAMINOPHEN 10-325 MG PO TABS: 1.0000 | ORAL_TABLET | Freq: Four times a day (QID) | ORAL | 0 refills | Status: AC | PRN

## 2018-06-07 MED ORDER — PROMETHAZINE HCL 25 MG PO TABS: 25.0000 mg | ORAL_TABLET | Freq: Three times a day (TID) | ORAL | 0 refills | Status: DC | PRN

## 2018-06-07 MED ORDER — ONDANSETRON HCL 4 MG PO TABS: 4.0000 mg | ORAL_TABLET | Freq: Three times a day (TID) | ORAL | 0 refills | Status: DC | PRN

## 2018-06-07 MED ORDER — OXYCODONE-ACETAMINOPHEN 5-325 MG PO TABS: 1.0000 | ORAL_TABLET | Freq: Four times a day (QID) | ORAL | 0 refills | Status: DC | PRN

## 2018-06-07 MED ORDER — CEPHALEXIN 500 MG PO CAPS: 500.0000 mg | ORAL_CAPSULE | Freq: Three times a day (TID) | ORAL | 0 refills | Status: DC

## 2018-06-07 NOTE — Telephone Encounter (Signed)
Dr. Al CorpusHyatt states okay to change to phenergan. I informed pt and she agreed.

## 2018-06-07 NOTE — Telephone Encounter (Signed)
I called pt, informed [facility] was [facility] to [facility] [address] [place]. Pt's husband said [facility] [address].

## 2018-06-07 NOTE — Telephone Encounter (Signed)
I did it.

## 2018-06-07 NOTE — Telephone Encounter (Signed)
Left message for pt to call to discuss the zofran.

## 2018-06-07 NOTE — Telephone Encounter (Signed)
Pt called [facility]. [facility] was [facility] and ask that we [facility] [address]

## 2018-06-09 NOTE — Progress Notes (Signed)
She presents today for her first postop visit date of surgery 06/01/2018.  Status post [place] bunionectomy left.  Denies fever chills nausea vomiting muscle aches pain states is been a little tender.  Objective: Vital signs are stable alert and oriented x3.  Pulses are palpable.  Moderate edema no erythema cellulitis drainage or odor good range of motion of the first metatarsophalangeal joint radiographs confirm good placement of capital osteotomy with good internal fixation in good position.  Assessment: Well healed surgical foot.  Plan: Rewrote prescription for pain medication provided her with a dresser compressive dressing continue to keep foot elevated and continue range of motion.

## 2018-06-14 ENCOUNTER — Ambulatory Visit (INDEPENDENT_AMBULATORY_CARE_PROVIDER_SITE_OTHER): Payer: 59 | Admitting: Podiatry

## 2018-06-14 DIAGNOSIS — M2012 Hallux valgus (acquired), left foot: Secondary | ICD-10-CM | POA: Diagnosis not present

## 2018-06-14 DIAGNOSIS — Z09 Encounter for follow-up examination after completed treatment for conditions other than malignant neoplasm: Secondary | ICD-10-CM

## 2018-06-14 NOTE — Progress Notes (Signed)
This patient presents the office 2 weeks after having foot surgery by Dr. Al CorpusHyatt.  Patient had an [place] [place] bunionectomy of the left foot performed.  She presents the office today wearing a bandage that was applied her last visit.  She says that her pain is approximately 4 out of 10 and she has stopped taking the stronger medication.  She presents the office today for continued evaluation of her surgical site.  Objective good wound correction and sutures are intact over the first metatarsal left foot.  Xkay no evidence of any redness or fluctuance or infection noted.  Patient has normal range of motion first MPJ left foot.    S/P foot surgery.  POV #2.  Sutures were removed from the surgical site.  Told this patient that her foot can now become wet but I would still recommend that she ambulate in the cam walker.  Patient was dispensed a compression sock to be worn on her foot to help limit swelling.  Patient to return to the office at her previously scheduled appointment with Dr. Al CorpusHyatt.   Helane GuntherGregory Kewanna Kasprzak DPM

## 2018-06-28 ENCOUNTER — Ambulatory Visit (INDEPENDENT_AMBULATORY_CARE_PROVIDER_SITE_OTHER): Payer: 59 | Admitting: Podiatry

## 2018-06-28 ENCOUNTER — Ambulatory Visit (INDEPENDENT_AMBULATORY_CARE_PROVIDER_SITE_OTHER): Payer: 59

## 2018-06-28 DIAGNOSIS — M778 Other enthesopathies, not elsewhere classified: Secondary | ICD-10-CM

## 2018-06-28 DIAGNOSIS — Z09 Encounter for follow-up examination after completed treatment for conditions other than malignant neoplasm: Secondary | ICD-10-CM

## 2018-06-28 DIAGNOSIS — M2012 Hallux valgus (acquired), left foot: Secondary | ICD-10-CM

## 2018-06-28 DIAGNOSIS — M779 Enthesopathy, unspecified: Secondary | ICD-10-CM

## 2018-06-28 NOTE — Progress Notes (Signed)
Presents today for postop visit date of surgery 06/01/2018 status post [facility] bunionectomy left foot.  My foot is tender at times when I do a lot of walking or overdo it but overall it seems to be doing very well.  Objective: Vital signs are stable she alert and oriented x3.  Very minimal edema no erythema cellulitis drainage odor incision site appears to be healing very nicely she has great range of motion both passive and active.  Radiographs taken today demonstrate a well-healing osteotomy.  Assessment: Well-healed surgical foot.  Plan: I will follow-up with her in 1 month.

## 2018-07-12 ENCOUNTER — Other Ambulatory Visit: Payer: 59

## 2018-07-24 ENCOUNTER — Ambulatory Visit (INDEPENDENT_AMBULATORY_CARE_PROVIDER_SITE_OTHER): Payer: 59 | Admitting: Podiatry

## 2018-07-24 ENCOUNTER — Ambulatory Visit (INDEPENDENT_AMBULATORY_CARE_PROVIDER_SITE_OTHER): Payer: 59

## 2018-07-24 DIAGNOSIS — Z09 Encounter for follow-up examination after completed treatment for conditions other than malignant neoplasm: Secondary | ICD-10-CM

## 2018-07-24 DIAGNOSIS — M2012 Hallux valgus (acquired), left foot: Secondary | ICD-10-CM

## 2018-07-24 NOTE — Progress Notes (Signed)
She presents today for postop visit date of surgery 06/01/2018 status post [facility] bunionectomy left.  States that I wearing a regular shoe takes a little bit and swells up by the end of the day but all in all I think I am doing very well to be chasing a toddler around the house.  Objective: Vital signs are stable alert oriented x3.  Pulses are palpable.  Hardly any edema to the left foot whatsoever she has great range of motion dorsiflexion plantarflexion first metatarsophalangeal joint left.  Radiographs confirm good placement of the capital osteotomy with internal fixation in good position.  Bone is healing approximately 75%.  Assessment: Well-healing surgical foot left.  Plan: Discussed etiology pathology conservative versus surgical therapies.  At this point I would encourage her to continue ambulating in shoe gear but not to overstress the foot with exercise.  We will follow-up with her in 1 month at which time if the bone is healed sufficiently will allow her to get back to her regular exercise  Program.

## 2018-08-21 ENCOUNTER — Ambulatory Visit (INDEPENDENT_AMBULATORY_CARE_PROVIDER_SITE_OTHER): Payer: 59

## 2018-08-21 ENCOUNTER — Ambulatory Visit (INDEPENDENT_AMBULATORY_CARE_PROVIDER_SITE_OTHER): Payer: 59 | Admitting: Podiatry

## 2018-08-21 ENCOUNTER — Encounter: Payer: Self-pay | Admitting: Podiatry

## 2018-08-21 DIAGNOSIS — Z9889 Other specified postprocedural states: Secondary | ICD-10-CM

## 2018-08-21 DIAGNOSIS — M2012 Hallux valgus (acquired), left foot: Secondary | ICD-10-CM | POA: Diagnosis not present

## 2018-08-21 NOTE — Progress Notes (Signed)
She presents today for a postop visit date of surgery is 06/01/2018 status post Miranda Hoover bunionectomy left states that is feeling fine no problems whatsoever.  Objective: Vital signs are stable she is alert and oriented x3 there is no erythema edema cellulitis drainage odor great range of motion of the first metatarsophalangeal joint.  Radiographs taken today demonstrate a well-healing osteotomy with screw fixation intact.  Assessment: Well-healing surgical foot.  Plan: Allow her to get back to her regular routine slowly over the next month she will increase back to her normal activity level and I will follow-up with her in 1 month if necessary.
